# Patient Record
Sex: Female | Born: 1975 | Race: Black or African American | Hispanic: No | Marital: Married | Smoking: Current every day smoker
Health system: Southern US, Community
[De-identification: ages and names within clinical notes are randomized; demographics above are authoritative.]

## PROBLEM LIST (undated history)

## (undated) DIAGNOSIS — F32A Depression, unspecified: Secondary | ICD-10-CM

## (undated) DIAGNOSIS — F329 Major depressive disorder, single episode, unspecified: Secondary | ICD-10-CM

## (undated) DIAGNOSIS — R569 Unspecified convulsions: Secondary | ICD-10-CM

## (undated) DIAGNOSIS — B009 Herpesviral infection, unspecified: Secondary | ICD-10-CM

## (undated) DIAGNOSIS — G4733 Obstructive sleep apnea (adult) (pediatric): Secondary | ICD-10-CM

## (undated) DIAGNOSIS — K5909 Other constipation: Secondary | ICD-10-CM

## (undated) DIAGNOSIS — F419 Anxiety disorder, unspecified: Secondary | ICD-10-CM

## (undated) DIAGNOSIS — J189 Pneumonia, unspecified organism: Secondary | ICD-10-CM

## (undated) DIAGNOSIS — D649 Anemia, unspecified: Secondary | ICD-10-CM

## (undated) DIAGNOSIS — I10 Essential (primary) hypertension: Secondary | ICD-10-CM

## (undated) HISTORY — PX: WRIST FRACTURE SURGERY: SHX121

## (undated) HISTORY — PX: TUBAL LIGATION: SHX77

## (undated) HISTORY — PX: ANKLE SURGERY: SHX546

## (undated) HISTORY — PX: CHOLECYSTECTOMY: SHX55

## (undated) HISTORY — PX: IMPLANTATION VAGAL NERVE STIMULATOR: SUR692

## (undated) HISTORY — PX: HIP SURGERY: SHX245

## (undated) HISTORY — DX: Obstructive sleep apnea (adult) (pediatric): G47.33

---
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
## 1998-01-09 ENCOUNTER — Inpatient Hospital Stay (HOSPITAL_COMMUNITY): Admission: AD | Admit: 1998-01-09 | Discharge: 1998-01-09 | Payer: Self-pay | Admitting: Obstetrics

## 1998-01-18 ENCOUNTER — Inpatient Hospital Stay (HOSPITAL_COMMUNITY): Admission: AD | Admit: 1998-01-18 | Discharge: 1998-01-20 | Payer: Self-pay | Admitting: Obstetrics & Gynecology

## 1998-05-22 ENCOUNTER — Inpatient Hospital Stay (HOSPITAL_COMMUNITY): Admission: EM | Admit: 1998-05-22 | Discharge: 1998-05-23 | Payer: Self-pay | Admitting: Emergency Medicine

## 1998-05-22 ENCOUNTER — Inpatient Hospital Stay (HOSPITAL_COMMUNITY): Admission: AD | Admit: 1998-05-22 | Discharge: 1998-05-22 | Payer: Self-pay | Admitting: *Deleted

## 1999-02-08 ENCOUNTER — Encounter: Payer: Self-pay | Admitting: Emergency Medicine

## 1999-02-08 ENCOUNTER — Emergency Department (HOSPITAL_COMMUNITY): Admission: EM | Admit: 1999-02-08 | Discharge: 1999-02-08 | Payer: Self-pay | Admitting: Emergency Medicine

## 2000-07-14 ENCOUNTER — Emergency Department (HOSPITAL_COMMUNITY): Admission: EM | Admit: 2000-07-14 | Discharge: 2000-07-15 | Payer: Self-pay | Admitting: Emergency Medicine

## 2000-11-08 ENCOUNTER — Emergency Department (HOSPITAL_COMMUNITY): Admission: EM | Admit: 2000-11-08 | Discharge: 2000-11-08 | Payer: Self-pay | Admitting: Emergency Medicine

## 2000-11-08 ENCOUNTER — Encounter: Payer: Self-pay | Admitting: Emergency Medicine

## 2000-12-06 ENCOUNTER — Emergency Department (HOSPITAL_COMMUNITY): Admission: EM | Admit: 2000-12-06 | Discharge: 2000-12-07 | Payer: Self-pay | Admitting: *Deleted

## 2000-12-07 ENCOUNTER — Encounter: Payer: Self-pay | Admitting: Emergency Medicine

## 2001-11-07 ENCOUNTER — Emergency Department (HOSPITAL_COMMUNITY): Admission: EM | Admit: 2001-11-07 | Discharge: 2001-11-07 | Payer: Self-pay | Admitting: Emergency Medicine

## 2002-01-22 ENCOUNTER — Emergency Department (HOSPITAL_COMMUNITY): Admission: EM | Admit: 2002-01-22 | Discharge: 2002-01-23 | Payer: Self-pay | Admitting: Emergency Medicine

## 2002-02-23 ENCOUNTER — Emergency Department (HOSPITAL_COMMUNITY): Admission: EM | Admit: 2002-02-23 | Discharge: 2002-02-23 | Payer: Self-pay | Admitting: Emergency Medicine

## 2002-03-06 ENCOUNTER — Emergency Department (HOSPITAL_COMMUNITY): Admission: EM | Admit: 2002-03-06 | Discharge: 2002-03-06 | Payer: Self-pay | Admitting: Emergency Medicine

## 2002-05-01 ENCOUNTER — Emergency Department (HOSPITAL_COMMUNITY): Admission: EM | Admit: 2002-05-01 | Discharge: 2002-05-01 | Payer: Self-pay | Admitting: Emergency Medicine

## 2002-06-07 ENCOUNTER — Encounter: Payer: Self-pay | Admitting: Emergency Medicine

## 2002-06-07 ENCOUNTER — Emergency Department (HOSPITAL_COMMUNITY): Admission: EM | Admit: 2002-06-07 | Discharge: 2002-06-07 | Payer: Self-pay | Admitting: Emergency Medicine

## 2002-09-08 ENCOUNTER — Encounter: Payer: Self-pay | Admitting: Emergency Medicine

## 2002-09-08 ENCOUNTER — Emergency Department (HOSPITAL_COMMUNITY): Admission: EM | Admit: 2002-09-08 | Discharge: 2002-09-08 | Payer: Self-pay | Admitting: Emergency Medicine

## 2002-11-14 ENCOUNTER — Ambulatory Visit (HOSPITAL_COMMUNITY): Admission: AD | Admit: 2002-11-14 | Discharge: 2002-11-14 | Payer: Self-pay | Admitting: Obstetrics and Gynecology

## 2002-11-14 ENCOUNTER — Encounter: Payer: Self-pay | Admitting: Obstetrics and Gynecology

## 2002-11-14 ENCOUNTER — Encounter (INDEPENDENT_AMBULATORY_CARE_PROVIDER_SITE_OTHER): Payer: Self-pay

## 2002-11-14 ENCOUNTER — Encounter: Payer: Self-pay | Admitting: Emergency Medicine

## 2002-11-17 ENCOUNTER — Inpatient Hospital Stay (HOSPITAL_COMMUNITY): Admission: AD | Admit: 2002-11-17 | Discharge: 2002-11-17 | Payer: Self-pay | Admitting: *Deleted

## 2003-03-18 ENCOUNTER — Inpatient Hospital Stay (HOSPITAL_COMMUNITY): Admission: AD | Admit: 2003-03-18 | Discharge: 2003-03-18 | Payer: Self-pay | Admitting: Obstetrics and Gynecology

## 2003-04-30 ENCOUNTER — Emergency Department (HOSPITAL_COMMUNITY): Admission: EM | Admit: 2003-04-30 | Discharge: 2003-04-30 | Payer: Self-pay | Admitting: Emergency Medicine

## 2003-04-30 ENCOUNTER — Encounter: Payer: Self-pay | Admitting: Emergency Medicine

## 2003-05-07 ENCOUNTER — Emergency Department (HOSPITAL_COMMUNITY): Admission: EM | Admit: 2003-05-07 | Discharge: 2003-05-07 | Payer: Self-pay | Admitting: *Deleted

## 2003-10-08 ENCOUNTER — Emergency Department (HOSPITAL_COMMUNITY): Admission: EM | Admit: 2003-10-08 | Discharge: 2003-10-08 | Payer: Self-pay | Admitting: Emergency Medicine

## 2003-10-16 ENCOUNTER — Other Ambulatory Visit: Admission: RE | Admit: 2003-10-16 | Discharge: 2003-10-16 | Payer: Self-pay | Admitting: Obstetrics and Gynecology

## 2003-11-04 HISTORY — PX: ECTOPIC PREGNANCY SURGERY: SHX613

## 2004-08-22 ENCOUNTER — Emergency Department (HOSPITAL_COMMUNITY): Admission: EM | Admit: 2004-08-22 | Discharge: 2004-08-22 | Payer: Self-pay | Admitting: Emergency Medicine

## 2004-09-24 ENCOUNTER — Inpatient Hospital Stay (HOSPITAL_COMMUNITY): Admission: AD | Admit: 2004-09-24 | Discharge: 2004-09-24 | Payer: Self-pay | Admitting: Obstetrics and Gynecology

## 2004-12-04 ENCOUNTER — Emergency Department (HOSPITAL_COMMUNITY): Admission: AD | Admit: 2004-12-04 | Discharge: 2004-12-04 | Payer: Self-pay | Admitting: Family Medicine

## 2005-03-15 ENCOUNTER — Emergency Department (HOSPITAL_COMMUNITY): Admission: EM | Admit: 2005-03-15 | Discharge: 2005-03-15 | Payer: Self-pay | Admitting: Family Medicine

## 2005-05-06 ENCOUNTER — Emergency Department (HOSPITAL_COMMUNITY): Admission: EM | Admit: 2005-05-06 | Discharge: 2005-05-06 | Payer: Self-pay | Admitting: Emergency Medicine

## 2005-08-09 ENCOUNTER — Emergency Department (HOSPITAL_COMMUNITY): Admission: EM | Admit: 2005-08-09 | Discharge: 2005-08-09 | Payer: Self-pay | Admitting: Emergency Medicine

## 2006-06-11 ENCOUNTER — Emergency Department (HOSPITAL_COMMUNITY): Admission: EM | Admit: 2006-06-11 | Discharge: 2006-06-12 | Payer: Self-pay | Admitting: Emergency Medicine

## 2008-10-27 ENCOUNTER — Emergency Department (HOSPITAL_COMMUNITY): Admission: EM | Admit: 2008-10-27 | Discharge: 2008-10-27 | Payer: Self-pay | Admitting: Family Medicine

## 2008-10-28 ENCOUNTER — Emergency Department (HOSPITAL_COMMUNITY): Admission: EM | Admit: 2008-10-28 | Discharge: 2008-10-28 | Payer: Self-pay | Admitting: Emergency Medicine

## 2008-11-12 ENCOUNTER — Emergency Department (HOSPITAL_COMMUNITY): Admission: EM | Admit: 2008-11-12 | Discharge: 2008-11-12 | Payer: Self-pay | Admitting: Family Medicine

## 2009-07-18 ENCOUNTER — Inpatient Hospital Stay (HOSPITAL_COMMUNITY): Admission: EM | Admit: 2009-07-18 | Discharge: 2009-07-22 | Payer: Self-pay | Admitting: Emergency Medicine

## 2009-07-18 ENCOUNTER — Ambulatory Visit: Payer: Self-pay | Admitting: Pulmonary Disease

## 2009-09-09 ENCOUNTER — Emergency Department (HOSPITAL_COMMUNITY): Admission: EM | Admit: 2009-09-09 | Discharge: 2009-09-09 | Payer: Self-pay | Admitting: Emergency Medicine

## 2010-02-16 ENCOUNTER — Inpatient Hospital Stay (HOSPITAL_COMMUNITY): Admission: AD | Admit: 2010-02-16 | Discharge: 2010-02-17 | Payer: Self-pay | Admitting: Obstetrics and Gynecology

## 2010-06-28 ENCOUNTER — Emergency Department (HOSPITAL_COMMUNITY): Admission: EM | Admit: 2010-06-28 | Discharge: 2010-06-28 | Payer: Self-pay | Admitting: Emergency Medicine

## 2010-08-30 ENCOUNTER — Emergency Department (HOSPITAL_COMMUNITY): Admission: EM | Admit: 2010-08-30 | Discharge: 2010-08-30 | Payer: Self-pay | Admitting: Emergency Medicine

## 2010-10-24 ENCOUNTER — Emergency Department (HOSPITAL_COMMUNITY): Admission: EM | Admit: 2010-10-24 | Discharge: 2010-10-24 | Payer: Self-pay | Admitting: Emergency Medicine

## 2011-02-06 ENCOUNTER — Emergency Department (HOSPITAL_COMMUNITY)
Admission: EM | Admit: 2011-02-06 | Discharge: 2011-02-06 | Disposition: A | Discharge status: Home / Self Care | Payer: Managed Care, Other (non HMO) | Attending: Emergency Medicine | Admitting: Emergency Medicine

## 2011-02-06 DIAGNOSIS — R51 Headache: Secondary | ICD-10-CM | POA: Insufficient documentation

## 2011-02-06 DIAGNOSIS — G40909 Epilepsy, unspecified, not intractable, without status epilepticus: Secondary | ICD-10-CM | POA: Insufficient documentation

## 2011-02-06 DIAGNOSIS — F29 Unspecified psychosis not due to a substance or known physiological condition: Secondary | ICD-10-CM | POA: Insufficient documentation

## 2011-02-06 DIAGNOSIS — D649 Anemia, unspecified: Secondary | ICD-10-CM | POA: Insufficient documentation

## 2011-02-06 LAB — POCT I-STAT, CHEM 8
Calcium, Ion: 1.13 mmol/L (ref 1.12–1.32)
Creatinine, Ser: 0.9 mg/dL (ref 0.4–1.2)
Glucose, Bld: 89 mg/dL (ref 70–99)
HCT: 34 % — ABNORMAL LOW (ref 36.0–46.0)

## 2011-02-06 LAB — PHENYTOIN LEVEL, TOTAL: Phenytoin Lvl: <2.5 ug/mL — ABNORMAL LOW (ref 10.0–20.0)

## 2011-02-14 LAB — DIFFERENTIAL
Basophils Relative: 0 % (ref 0–1)
Eosinophils Absolute: 0.1 10*3/uL (ref 0.0–0.7)
Lymphocytes Relative: 11 % — ABNORMAL LOW (ref 12–46)
Monocytes Absolute: 0.3 10*3/uL (ref 0.1–1.0)
Neutrophils Relative %: 85 % — ABNORMAL HIGH (ref 43–77)

## 2011-02-14 LAB — POCT PREGNANCY, URINE: Preg Test, Ur: NEGATIVE

## 2011-02-14 LAB — RAPID URINE DRUG SCREEN, HOSP PERFORMED
Amphetamines: NOT DETECTED
Barbiturates: NOT DETECTED
Opiates: NOT DETECTED

## 2011-02-14 LAB — URINE MICROSCOPIC-ADD ON

## 2011-02-14 LAB — CBC
HCT: 37.3 % (ref 36.0–46.0)
Hemoglobin: 11.9 g/dL — ABNORMAL LOW (ref 12.0–15.0)
MCHC: 31.9 g/dL (ref 30.0–36.0)
RDW: 15.3 % (ref 11.5–15.5)
WBC: 8.7 10*3/uL (ref 4.0–10.5)

## 2011-02-14 LAB — POCT I-STAT, CHEM 8
Chloride: 111 mEq/L (ref 96–112)
HCT: 38 % (ref 36.0–46.0)
Hemoglobin: 12.9 g/dL (ref 12.0–15.0)
Potassium: 5.3 mEq/L — ABNORMAL HIGH (ref 3.5–5.1)

## 2011-02-14 LAB — BASIC METABOLIC PANEL
Calcium: 9.2 mg/dL (ref 8.4–10.5)
Creatinine, Ser: 0.68 mg/dL (ref 0.4–1.2)
GFR calc Af Amer: >60 mL/min (ref >60)
GFR calc non Af Amer: >60 mL/min (ref >60)
Sodium: 139 mEq/L (ref 135–145)

## 2011-02-14 LAB — GLUCOSE, CAPILLARY: Glucose-Capillary: 125 mg/dL — ABNORMAL HIGH (ref 70–99)

## 2011-02-14 LAB — URINALYSIS, ROUTINE W REFLEX MICROSCOPIC
Bilirubin Urine: NEGATIVE
Glucose, UA: NEGATIVE mg/dL
Specific Gravity, Urine: 1.026 (ref 1.005–1.030)
pH: 5.5 (ref 5.0–8.0)

## 2011-02-14 LAB — PHENYTOIN LEVEL, TOTAL: Phenytoin Lvl: <2.5 ug/mL — ABNORMAL LOW (ref 10.0–20.0)

## 2011-02-18 LAB — DIFFERENTIAL
Basophils Relative: 0 % (ref 0–1)
Eosinophils Relative: 1 % (ref 0–5)
Lymphs Abs: 2 10*3/uL (ref 0.7–4.0)
Monocytes Absolute: 0.3 10*3/uL (ref 0.1–1.0)
Monocytes Relative: 4 % (ref 3–12)
Neutro Abs: 4.6 10*3/uL (ref 1.7–7.7)

## 2011-02-18 LAB — COMPREHENSIVE METABOLIC PANEL
ALT: 13 U/L (ref 0–35)
BUN: 7 mg/dL (ref 6–23)
CO2: 26 mEq/L (ref 19–32)
Calcium: 9.2 mg/dL (ref 8.4–10.5)
Creatinine, Ser: 0.61 mg/dL (ref 0.4–1.2)
GFR calc non Af Amer: >60 mL/min (ref >60)
Glucose, Bld: 87 mg/dL (ref 70–99)
Sodium: 141 mEq/L (ref 135–145)
Total Protein: 6.7 g/dL (ref 6.0–8.3)

## 2011-02-18 LAB — CBC
HCT: 35.2 % — ABNORMAL LOW (ref 36.0–46.0)
MCHC: 34 g/dL (ref 30.0–36.0)
Platelets: ADEQUATE 10*3/uL (ref 150–400)
RDW: 15.6 % — ABNORMAL HIGH (ref 11.5–15.5)

## 2011-02-18 LAB — TYPE AND SCREEN
ABO/RH(D): O POS
Antibody Screen: NEGATIVE

## 2011-02-18 LAB — ABO/RH: ABO/RH(D): O POS

## 2011-02-26 LAB — GC/CHLAMYDIA PROBE AMP, GENITAL
Chlamydia, DNA Probe: NEGATIVE
GC Probe Amp, Genital: NEGATIVE

## 2011-02-26 LAB — URINALYSIS, ROUTINE W REFLEX MICROSCOPIC
Bilirubin Urine: NEGATIVE
Glucose, UA: NEGATIVE mg/dL
Ketones, ur: NEGATIVE mg/dL
Specific Gravity, Urine: 1.01 (ref 1.005–1.030)
pH: 6 (ref 5.0–8.0)

## 2011-02-26 LAB — CBC
Hemoglobin: 9.3 g/dL — ABNORMAL LOW (ref 12.0–15.0)
MCHC: 31.8 g/dL (ref 30.0–36.0)
RBC: 3.51 MIL/uL — ABNORMAL LOW (ref 3.87–5.11)
RDW: 19.1 % — ABNORMAL HIGH (ref 11.5–15.5)

## 2011-02-26 LAB — URINE MICROSCOPIC-ADD ON

## 2011-02-26 LAB — WET PREP, GENITAL: Yeast Wet Prep HPF POC: NONE SEEN

## 2011-02-26 LAB — POCT PREGNANCY, URINE: Preg Test, Ur: NEGATIVE

## 2011-03-09 LAB — CBC
HCT: 31 % — ABNORMAL LOW (ref 36.0–46.0)
MCV: 85.8 fL (ref 78.0–100.0)
Platelets: 253 10*3/uL (ref 150–400)
RDW: 17.8 % — ABNORMAL HIGH (ref 11.5–15.5)
WBC: 5.4 10*3/uL (ref 4.0–10.5)

## 2011-03-09 LAB — URINALYSIS, ROUTINE W REFLEX MICROSCOPIC
Ketones, ur: NEGATIVE mg/dL
Leukocytes, UA: NEGATIVE
Nitrite: POSITIVE — AB
Protein, ur: NEGATIVE mg/dL
Urobilinogen, UA: 0.2 mg/dL (ref 0.0–1.0)

## 2011-03-09 LAB — DIFFERENTIAL
Basophils Absolute: 0.1 10*3/uL (ref 0.0–0.1)
Basophils Relative: 1 % (ref 0–1)
Eosinophils Absolute: 0 10*3/uL (ref 0.0–0.7)
Eosinophils Relative: 0 % (ref 0–5)
Lymphs Abs: 1 10*3/uL (ref 0.7–4.0)
Neutrophils Relative %: 76 % (ref 43–77)

## 2011-03-09 LAB — BASIC METABOLIC PANEL
BUN: 11 mg/dL (ref 6–23)
Chloride: 107 mEq/L (ref 96–112)
Creatinine, Ser: 0.67 mg/dL (ref 0.4–1.2)
Glucose, Bld: 119 mg/dL — ABNORMAL HIGH (ref 70–99)
Potassium: 3.8 mEq/L (ref 3.5–5.1)

## 2011-03-09 LAB — URINE MICROSCOPIC-ADD ON

## 2011-03-09 LAB — POCT PREGNANCY, URINE: Preg Test, Ur: NEGATIVE

## 2011-03-11 LAB — GLUCOSE, CAPILLARY
Glucose-Capillary: 100 mg/dL — ABNORMAL HIGH (ref 70–99)
Glucose-Capillary: 113 mg/dL — ABNORMAL HIGH (ref 70–99)
Glucose-Capillary: 121 mg/dL — ABNORMAL HIGH (ref 70–99)
Glucose-Capillary: 150 mg/dL — ABNORMAL HIGH (ref 70–99)
Glucose-Capillary: 170 mg/dL — ABNORMAL HIGH (ref 70–99)
Glucose-Capillary: 187 mg/dL — ABNORMAL HIGH (ref 70–99)
Glucose-Capillary: 57 mg/dL — ABNORMAL LOW (ref 70–99)
Glucose-Capillary: 63 mg/dL — ABNORMAL LOW (ref 70–99)
Glucose-Capillary: 72 mg/dL (ref 70–99)
Glucose-Capillary: 73 mg/dL (ref 70–99)
Glucose-Capillary: 77 mg/dL (ref 70–99)
Glucose-Capillary: 83 mg/dL (ref 70–99)

## 2011-03-11 LAB — PHENYTOIN LEVEL, TOTAL
Phenytoin Lvl: 8.1 ug/mL — ABNORMAL LOW (ref 10.0–20.0)
Phenytoin Lvl: 12.2 ug/mL (ref 10.0–20.0)

## 2011-03-11 LAB — DIFFERENTIAL
Eosinophils Relative: 1 % (ref 0–5)
Lymphocytes Relative: 20 % (ref 12–46)
Lymphs Abs: 1.3 10*3/uL (ref 0.7–4.0)
Monocytes Absolute: 0.3 10*3/uL (ref 0.1–1.0)

## 2011-03-11 LAB — PROTIME-INR: INR: 1.1 (ref 0.00–1.49)

## 2011-03-11 LAB — CBC
HCT: 26.7 % — ABNORMAL LOW (ref 36.0–46.0)
Hemoglobin: 8.8 g/dL — ABNORMAL LOW (ref 12.0–15.0)
Hemoglobin: 8.9 g/dL — ABNORMAL LOW (ref 12.0–15.0)
MCHC: 32.8 g/dL (ref 30.0–36.0)
MCHC: 32.9 g/dL (ref 30.0–36.0)
MCV: 88.4 fL (ref 78.0–100.0)
Platelets: 168 10*3/uL (ref 150–400)
Platelets: 249 10*3/uL (ref 150–400)
RBC: 2.96 MIL/uL — ABNORMAL LOW (ref 3.87–5.11)
RBC: 3.05 MIL/uL — ABNORMAL LOW (ref 3.87–5.11)
RBC: 3.09 MIL/uL — ABNORMAL LOW (ref 3.87–5.11)
WBC: 4.7 10*3/uL (ref 4.0–10.5)
WBC: 6.8 10*3/uL (ref 4.0–10.5)
WBC: 6.6 10*3/uL (ref 4.0–10.5)

## 2011-03-11 LAB — BASIC METABOLIC PANEL
CO2: 24 mEq/L (ref 19–32)
CO2: 27 mEq/L (ref 19–32)
Calcium: 8.1 mg/dL — ABNORMAL LOW (ref 8.4–10.5)
Calcium: 8.3 mg/dL — ABNORMAL LOW (ref 8.4–10.5)
Calcium: 8.8 mg/dL (ref 8.4–10.5)
Creatinine, Ser: 0.58 mg/dL (ref 0.4–1.2)
Creatinine, Ser: 0.58 mg/dL (ref 0.4–1.2)
GFR calc Af Amer: >60 mL/min (ref >60)
GFR calc Af Amer: >60 mL/min (ref >60)
GFR calc Af Amer: >60 mL/min (ref >60)
GFR calc non Af Amer: >60 mL/min (ref >60)
GFR calc non Af Amer: >60 mL/min (ref >60)
Glucose, Bld: 97 mg/dL (ref 70–99)
Potassium: 2.9 mEq/L — ABNORMAL LOW (ref 3.5–5.1)
Potassium: 3 mEq/L — ABNORMAL LOW (ref 3.5–5.1)
Sodium: 136 mEq/L (ref 135–145)
Sodium: 141 mEq/L (ref 135–145)
Sodium: 137 mEq/L (ref 135–145)

## 2011-03-11 LAB — LEGIONELLA ANTIGEN, URINE: Legionella Antigen, Urine: NEGATIVE

## 2011-03-11 LAB — POCT I-STAT 3, VENOUS BLOOD GAS (G3P V)
Bicarbonate: 22.2 mEq/L (ref 20.0–24.0)
TCO2: 24 mmol/L (ref 0–100)
pCO2, Ven: 48.5 mmHg (ref 45.0–50.0)
pH, Ven: 7.267 (ref 7.250–7.300)

## 2011-03-11 LAB — CULTURE, BLOOD (ROUTINE X 2)
Culture: NO GROWTH
Culture: NO GROWTH

## 2011-03-11 LAB — COMPREHENSIVE METABOLIC PANEL
AST: 30 U/L (ref 0–37)
Albumin: 3.8 g/dL (ref 3.5–5.2)
Chloride: 109 mEq/L (ref 96–112)
Creatinine, Ser: 0.75 mg/dL (ref 0.4–1.2)
GFR calc Af Amer: >60 mL/min (ref >60)
Sodium: 140 mEq/L (ref 135–145)
Total Bilirubin: 0.7 mg/dL (ref 0.3–1.2)

## 2011-03-11 LAB — URINE CULTURE: Colony Count: >=100000

## 2011-03-11 LAB — RAPID URINE DRUG SCREEN, HOSP PERFORMED
Amphetamines: NOT DETECTED
Opiates: NOT DETECTED
Tetrahydrocannabinol: POSITIVE — AB

## 2011-03-11 LAB — POCT I-STAT 3, ART BLOOD GAS (G3+)
Acid-Base Excess: 3 mmol/L — ABNORMAL HIGH (ref 0.0–2.0)
Acid-base deficit: 2 mmol/L (ref 0.0–2.0)
Bicarbonate: 22.8 mEq/L (ref 20.0–24.0)
O2 Saturation: 98 %
O2 Saturation: 100 %
TCO2: 25 mmol/L (ref 0–100)
pCO2 arterial: 40.4 mmHg (ref 35.0–45.0)
pCO2 arterial: 23.8 mmHg — ABNORMAL LOW (ref 35.0–45.0)
pCO2 arterial: 29.1 mmHg — ABNORMAL LOW (ref 35.0–45.0)
pCO2 arterial: 30.6 mmHg — ABNORMAL LOW (ref 35.0–45.0)
pH, Arterial: 7.501 — ABNORMAL HIGH (ref 7.350–7.400)
pO2, Arterial: 115 mmHg — ABNORMAL HIGH (ref 80.0–100.0)
pO2, Arterial: 138 mmHg — ABNORMAL HIGH (ref 80.0–100.0)
pO2, Arterial: 212 mmHg — ABNORMAL HIGH (ref 80.0–100.0)

## 2011-03-11 LAB — URINALYSIS, ROUTINE W REFLEX MICROSCOPIC
Nitrite: POSITIVE — AB
Specific Gravity, Urine: 1.02 (ref 1.005–1.030)
Urobilinogen, UA: 0.2 mg/dL (ref 0.0–1.0)

## 2011-03-11 LAB — POCT CARDIAC MARKERS
CKMB, poc: <1 ng/mL — ABNORMAL LOW (ref 1.0–8.0)
Troponin i, poc: <0.05 ng/mL (ref 0.00–0.09)

## 2011-03-11 LAB — MISCELLANEOUS TEST

## 2011-03-11 LAB — CULTURE, RESPIRATORY W GRAM STAIN: Culture: NORMAL

## 2011-03-11 LAB — POCT PREGNANCY, URINE: Preg Test, Ur: NEGATIVE

## 2011-03-11 LAB — MRSA CULTURE

## 2011-03-11 LAB — URINE MICROSCOPIC-ADD ON

## 2011-03-11 LAB — CARDIAC PANEL(CRET KIN+CKTOT+MB+TROPI)
CK, MB: 1.1 ng/mL (ref 0.3–4.0)
CK, MB: 1.5 ng/mL (ref 0.3–4.0)
Total CK: 232 U/L — ABNORMAL HIGH (ref 7–177)
Troponin I: 0.02 ng/mL (ref 0.00–0.06)

## 2011-03-11 LAB — CK TOTAL AND CKMB (NOT AT ARMC)
CK, MB: 1.7 ng/mL (ref 0.3–4.0)
Relative Index: 0.8 (ref 0.0–2.5)

## 2011-03-11 LAB — MAGNESIUM
Magnesium: 1.9 mg/dL (ref 1.5–2.5)
Magnesium: 1.9 mg/dL (ref 1.5–2.5)

## 2011-03-11 LAB — ETHANOL: Alcohol, Ethyl (B): <5 mg/dL (ref 0–10)

## 2011-03-11 LAB — PHOSPHORUS: Phosphorus: 3 mg/dL (ref 2.3–4.6)

## 2011-03-22 ENCOUNTER — Emergency Department (HOSPITAL_COMMUNITY)
Admission: EM | Admit: 2011-03-22 | Discharge: 2011-03-22 | Disposition: A | Discharge status: Home / Self Care | Payer: Managed Care, Other (non HMO) | Attending: Emergency Medicine | Admitting: Emergency Medicine

## 2011-03-22 DIAGNOSIS — Z9119 Patient's noncompliance with other medical treatment and regimen: Secondary | ICD-10-CM | POA: Insufficient documentation

## 2011-03-22 DIAGNOSIS — Z79899 Other long term (current) drug therapy: Secondary | ICD-10-CM | POA: Insufficient documentation

## 2011-03-22 DIAGNOSIS — G40909 Epilepsy, unspecified, not intractable, without status epilepticus: Secondary | ICD-10-CM | POA: Insufficient documentation

## 2011-03-22 DIAGNOSIS — Z91199 Patient's noncompliance with other medical treatment and regimen due to unspecified reason: Secondary | ICD-10-CM | POA: Insufficient documentation

## 2011-03-22 LAB — POCT I-STAT, CHEM 8
Calcium, Ion: 1.26 mmol/L (ref 1.12–1.32)
Chloride: 107 mEq/L (ref 96–112)
Creatinine, Ser: 0.9 mg/dL (ref 0.4–1.2)
Glucose, Bld: 79 mg/dL (ref 70–99)
HCT: 35 % — ABNORMAL LOW (ref 36.0–46.0)
Potassium: 4.2 mEq/L (ref 3.5–5.1)
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
## 2011-04-18 NOTE — Consult Note (Signed)
NAMEABIGAEL, MOGLE NO.:  [id number]   MEDICAL RECORD NO.:  [id number]          PATIENT TYPE:  INP   LOCATION:  2111                         FACILITY:  [facility]   PHYSICIAN:  Noel Christmas, MD    DATE OF BIRTH:  09/16/1976   DATE OF CONSULTATION:  07/18/2009  DATE OF DISCHARGE:                                 CONSULTATION   REFERRING SERVICE:  PCCM, MD   REASON FOR CONSULTATION:  Recurrent generalized seizures.   HISTORY OF PRESENT ILLNESS:  This is a 35 year old African American lady  who presented with history of three witnessed generalized seizures.  The  first occurred around 6 a.m. today and following two occurred after  arriving in the emergency room.  The patient was initially given Ativan.  Because of recurrent seizures, she was subsequently given Versed and  succinylcholine and intubated and placed on mechanical ventilation.  She  has required fentanyl for sedation.  She has had no recurrent seizures  since intubation.  Dilantin load 1000 mg was ordered and is being  started at this point.  The patient has no history of seizure activity.  She is being treated for urinary tract infection and was started on two  antibiotics within the past few days one of which is Bactrim.  The other  one is unclear but the family will bring in her medications for  verification.  CT scan of her head was unremarkable with no signs of  acute intracranial hemorrhage.  Urine drug screen was positive for THC  and benzodiazepines.   PAST MEDICAL HISTORY:  Remarkable for herpes simplex (unclear type from  history available so far).   CURRENT MEDICATIONS:  None other than Bactrim.   FAMILY HISTORY:  Noncontributory.   EXAMINATION:  Appearance was that of an obese young lady who was  intubated and on mechanical ventilation.  The patient had spontaneous  respirations as well.  She was responsive slightly to noxious stimuli.  Pupils were equal and reactive normally to light.   Extraocular movements  were intact to oculocephalic maneuvers.  There was no facial asymmetry.  Muscle tone was flaccid throughout.  She had no abnormal posturing and  no spontaneous movements.  Deep tendon reflexes were 2+ and symmetrical  throughout.  Plantar responses were flexor.  Carotid auscultation was  normal.   CLINICAL IMPRESSION:  1. New-onset generalized seizures of unclear etiology.  Possible      reaction to antibiotic regimen for urinary tract infection cannot      be ruled out.  2. History of herpes simplex of unclear significance with respect to      the patient's presentation, if any.  It is doubtful if the patient      has herpes encephalitis, particularly if she has genital herpes by      history.  3. No signs of an acute stroke nor significant intracranial pathology      otherwise.   RECOMMENDATIONS:  1. MRI of the brain without and with contrast.  2. EEG in the a.m.  3. Continue Dilantin at 100 mg q.8 hours.  4. Dilantin level this  evening as well as in the a.m.   Thank you for asking me to evaluate [name].      Noel Christmas, MD  Electronically Signed     CS/MEDQ  D:  07/18/2009  T:  07/18/2009  Job:  [phone]

## 2011-04-18 NOTE — Discharge Summary (Signed)
Veronica Francis, Veronica Francis            ACCOUNT NO.:  [id number]   MEDICAL RECORD NO.:  [id number]          PATIENT TYPE:  INP   LOCATION:  5526                         FACILITY:  [facility]   PHYSICIAN:  Beckey Rutter, MD  DATE OF BIRTH:  12/24/1975   DATE OF ADMISSION:  07/18/2009  DATE OF DISCHARGE:  07/22/2009                               DISCHARGE SUMMARY   PRIMARY CARE PHYSICIAN:  Unassigned.   HISTORY OF PRESENT ILLNESS:  This is a 35 year old African American  female with past medical history of herpes simplex who presented with  seizure and admitted to the ICU secondary to his respiratory failure  secondary to seizures.  The patient weaned off the ventilator as  tolerated.   DISCHARGE DIAGNOSES:  1. New onset seizure.  Still questionable if the patient has true      seizures.  Nevertheless, the patient will be discharged with      Dilantin.  2. Ventilator-dependent respiratory failure secondary to seizures.  3. Urinary tract infection treated with intravenous antibiotic.  4. History of herpes simplex.   DISCHARGE MEDICATIONS:  1. Ciprofloxacin 500 mg p.o. twice a day for two more days.  2. Celexa 10 mg p.o. daily.  3. Zyprexa 10 mg p.o. q. 12.  4. Protonix 40 mg IV.  5. Dilantin 100 mg p.o. every 8 hours.  6. Potassium chloride 20 mEq p.o. daily for three more days.   HOSPITAL CONSULTATION:  1. Critical Care Team.  The patient transferred to Medical Service as      of yesterday.  2. Neurology, Dr. Roseanne Reno.   HOSPITAL PROCEDURES:  1. EEG read today July 22, 2009 by Dr. Vickey Huger impression is no      evidence of seizure activity with a caveat of motion degraded      studies.  Chest x-ray on July 18, 2009 impression is endotracheal      tube in a good position.  2. CT head without contrast on July 18, 2009 the patient had no      acute intracranial pathology.  3. Chest x-ray on July 18, 2009 impression is right upper lobe      atelectasis.  4. MRI brain  without and with contrast on July 18, 2009 impression      is no acute intracranial normality.  Acute on chronic sinusitis.   DISCHARGE/PLAN:  I discussed the discharge plan at length with the  patient and the patient's husband for more than 20 minutes.  The patient  will be discharged on Dilantin.  She should follow up with neurologist  within a week.  The number for [facility] in [place] was  provided on the discharge instruction.  The patient was  advised to refrain from driving or to operate machinery before seeing  her neurologist.  She is aware and agreeable to discharge and followup  plan.   Time spent more than 30 minutes.      Beckey Rutter, MD  Electronically Signed     EME/MEDQ  D:  07/22/2009  T:  07/23/2009  Job:  [id number]

## 2011-04-21 NOTE — Op Note (Signed)
NAME:  Veronica Francis, Veronica Francis NO.:  [id number]   MEDICAL RECORD NO.:  [id number]                   PATIENT TYPE:  MAT   LOCATION:  MATC                                 FACILITY:  [facility]   PHYSICIAN:  Cynthia P. Romine, M.D.             DATE OF BIRTH:  May 11, 1976   DATE OF PROCEDURE:  11/14/2002  DATE OF DISCHARGE:                                 OPERATIVE REPORT   PREOPERATIVE DIAGNOSIS:  Ruptured right tubal ectopic pregnancy.   POSTOPERATIVE DIAGNOSIS:  Ruptured right tubal ectopic pregnancy, pathology  pending.   PROCEDURE:  Laparoscopic right salpingectomy.   SURGEON:  Cynthia P. Romine, M.D.   ANESTHESIA:  General endotracheal.   ESTIMATED BLOOD LOSS:  300 cc.   COMPLICATIONS:  None.   FINDINGS:  Upon entry of the laparoscope into the peritoneal cavity there  was noted to be a large amount of clotted blood in the abdomen and pelvis  approximately 300 cc.  The right tube was distended in its mid isthmic  portion with the bleeding area consistent with an ectopic pregnancy.  The  left tube and ovary appeared normal.   PROCEDURE:  The patient was taken to the operating room and after the  induction of adequate general endotracheal anesthesia was placed in the  dorsal lithotomy position and prepped and draped in the usual position.  Foley catheter had been previously inserted.  A Hulka uterine manipulator  was placed.  A subumbilical incision was made at the site of the previous  incision and a Veress needle was inserted into the peritoneal space.  Proper  placement was tested by noting free flow of saline through the Veress needle  with a negative aspirate and then by noting the response of a drop of saline  placed at the hub of the Veress needle to negative pressure after the  abdominal wall was elevated.  Pneumoperitoneum was created with 2 L CO2 with  the automatic insufflator.  A disposable 10-11 mm trocar was then inserted  into the peritoneal  space and its proper placement noted with the  laparoscope.  Transillumination was used to insert two 5 mm trocars directly  over the pubis on the right and left under direct visualization.  The uterus  was elevated.  The right tube was traced to its fimbriated end.  The dilated  isthmic area that was bleeding with the ruptured ectopic pregnancy was  identified.  Photographic documentation was taken.  A tripolar cautery unit  was used to cauterize the tube at its junction with the uterus and then  tripolar was used to cauterize and cut along the mesosalpinx to the  fimbriae.  The tube was grasped in its fimbriated end with pickups and  brought out through the left _______ trocar.  The incision line was  irrigated and was free of bleeding.  The right ovary appeared normal,  although it  had some filmy adhesions surrounding it.  On the left side the  tube and ovary also appeared normal.  Photographic documentation was taken.  A significant amount of time was spent clearing the pelvis and abdomen of  clots.  To be able to assure that hemostasis was intact, again, a suture  line was visualized and  noted to be hemostatic.  The instruments removed from the abdomen.  The  pneumoperitoneum was allowed to escape.  Sleeves were removed.  The  incisions were closed subcuticularly with 4-0 Vicryl Rapide and the  procedure was terminated.  The patient went in satisfactory condition to  post anesthesia recovery.                                               Cynthia P. Romine, M.D.    CPR/MEDQ  D:  11/14/2002  T:  11/14/2002  Job:  [id number]

## 2011-05-17 ENCOUNTER — Emergency Department (HOSPITAL_COMMUNITY)
Admission: EM | Admit: 2011-05-17 | Discharge: 2011-05-17 | Disposition: A | Discharge status: Home / Self Care | Payer: Managed Care, Other (non HMO) | Attending: Emergency Medicine | Admitting: Emergency Medicine

## 2011-05-17 DIAGNOSIS — Z79899 Other long term (current) drug therapy: Secondary | ICD-10-CM | POA: Insufficient documentation

## 2011-05-17 DIAGNOSIS — R6889 Other general symptoms and signs: Secondary | ICD-10-CM | POA: Insufficient documentation

## 2011-05-17 DIAGNOSIS — F29 Unspecified psychosis not due to a substance or known physiological condition: Secondary | ICD-10-CM | POA: Insufficient documentation

## 2011-05-17 DIAGNOSIS — G40909 Epilepsy, unspecified, not intractable, without status epilepticus: Secondary | ICD-10-CM | POA: Insufficient documentation

## 2011-05-17 LAB — POCT I-STAT, CHEM 8
BUN: 18 mg/dL (ref 6–23)
Calcium, Ion: 1.17 mmol/L (ref 1.12–1.32)
Chloride: 109 mEq/L (ref 96–112)
Creatinine, Ser: 0.6 mg/dL (ref 0.4–1.2)
Glucose, Bld: 108 mg/dL — ABNORMAL HIGH (ref 70–99)
HCT: 34 % — ABNORMAL LOW (ref 36.0–46.0)
Hemoglobin: 11.6 g/dL — ABNORMAL LOW (ref 12.0–15.0)
Potassium: 4 mEq/L (ref 3.5–5.1)
Sodium: 138 meq/L (ref 135–145)
TCO2: 20 mmol/L (ref 0–100)

## 2011-05-17 LAB — URINALYSIS, ROUTINE W REFLEX MICROSCOPIC
Bilirubin Urine: NEGATIVE
Glucose, UA: NEGATIVE mg/dL
Hgb urine dipstick: NEGATIVE
Ketones, ur: NEGATIVE mg/dL
Leukocytes, UA: NEGATIVE
Nitrite: NEGATIVE
Protein, ur: NEGATIVE mg/dL
Specific Gravity, Urine: 1.022 (ref 1.005–1.030)
Urobilinogen, UA: 0.2 mg/dL (ref 0.0–1.0)
pH: 6 (ref 5.0–8.0)

## 2011-05-17 LAB — RAPID URINE DRUG SCREEN, HOSP PERFORMED
Amphetamines: NOT DETECTED
Barbiturates: NOT DETECTED
Benzodiazepines: NOT DETECTED
Cocaine: NOT DETECTED
Opiates: NOT DETECTED
Tetrahydrocannabinol: POSITIVE — AB

## 2011-05-17 LAB — POCT PREGNANCY, URINE: Preg Test, Ur: NEGATIVE

## 2011-05-17 LAB — GLUCOSE, CAPILLARY: Glucose-Capillary: 107 mg/dL — ABNORMAL HIGH (ref 70–99)

## 2011-06-26 ENCOUNTER — Emergency Department (HOSPITAL_COMMUNITY): Payer: Managed Care, Other (non HMO)

## 2011-06-26 ENCOUNTER — Inpatient Hospital Stay (HOSPITAL_COMMUNITY): Payer: Managed Care, Other (non HMO)

## 2011-06-26 ENCOUNTER — Inpatient Hospital Stay (HOSPITAL_COMMUNITY)
Admission: EM | Admit: 2011-06-26 | Discharge: 2011-07-05 | DRG: 956 | Disposition: A | Discharge status: Skilled Nursing Facility | Payer: Managed Care, Other (non HMO) | Attending: General Surgery | Admitting: General Surgery

## 2011-06-26 DIAGNOSIS — S27329A Contusion of lung, unspecified, initial encounter: Secondary | ICD-10-CM | POA: Diagnosis present

## 2011-06-26 DIAGNOSIS — D62 Acute posthemorrhagic anemia: Secondary | ICD-10-CM | POA: Diagnosis not present

## 2011-06-26 DIAGNOSIS — S7290XA Unspecified fracture of unspecified femur, initial encounter for closed fracture: Secondary | ICD-10-CM

## 2011-06-26 DIAGNOSIS — F1210 Cannabis abuse, uncomplicated: Secondary | ICD-10-CM | POA: Diagnosis present

## 2011-06-26 DIAGNOSIS — S72309A Unspecified fracture of shaft of unspecified femur, initial encounter for closed fracture: Secondary | ICD-10-CM | POA: Diagnosis present

## 2011-06-26 DIAGNOSIS — J9600 Acute respiratory failure, unspecified whether with hypoxia or hypercapnia: Secondary | ICD-10-CM | POA: Diagnosis present

## 2011-06-26 DIAGNOSIS — E876 Hypokalemia: Secondary | ICD-10-CM | POA: Diagnosis not present

## 2011-06-26 DIAGNOSIS — G40909 Epilepsy, unspecified, not intractable, without status epilepticus: Secondary | ICD-10-CM | POA: Diagnosis present

## 2011-06-26 DIAGNOSIS — S0180XA Unspecified open wound of other part of head, initial encounter: Secondary | ICD-10-CM | POA: Diagnosis present

## 2011-06-26 DIAGNOSIS — F19921 Other psychoactive substance use, unspecified with intoxication with delirium: Secondary | ICD-10-CM | POA: Diagnosis not present

## 2011-06-26 DIAGNOSIS — F1010 Alcohol abuse, uncomplicated: Secondary | ICD-10-CM | POA: Diagnosis present

## 2011-06-26 DIAGNOSIS — S61209A Unspecified open wound of unspecified finger without damage to nail, initial encounter: Secondary | ICD-10-CM | POA: Diagnosis present

## 2011-06-26 DIAGNOSIS — S2249XA Multiple fractures of ribs, unspecified side, initial encounter for closed fracture: Secondary | ICD-10-CM | POA: Diagnosis present

## 2011-06-26 DIAGNOSIS — S82843B Displaced bimalleolar fracture of unspecified lower leg, initial encounter for open fracture type I or II: Principal | ICD-10-CM | POA: Diagnosis present

## 2011-06-26 DIAGNOSIS — I10 Essential (primary) hypertension: Secondary | ICD-10-CM | POA: Diagnosis not present

## 2011-06-26 DIAGNOSIS — S62123A Displaced fracture of lunate [semilunar], unspecified wrist, initial encounter for closed fracture: Secondary | ICD-10-CM | POA: Diagnosis present

## 2011-06-26 DIAGNOSIS — E669 Obesity, unspecified: Secondary | ICD-10-CM | POA: Diagnosis present

## 2011-06-26 DIAGNOSIS — S8253XA Displaced fracture of medial malleolus of unspecified tibia, initial encounter for closed fracture: Secondary | ICD-10-CM | POA: Diagnosis present

## 2011-06-26 DIAGNOSIS — Y92410 Unspecified street and highway as the place of occurrence of the external cause: Secondary | ICD-10-CM

## 2011-06-26 DIAGNOSIS — S82899A Other fracture of unspecified lower leg, initial encounter for closed fracture: Secondary | ICD-10-CM | POA: Diagnosis present

## 2011-06-26 DIAGNOSIS — S63036A Dislocation of midcarpal joint of unspecified wrist, initial encounter: Secondary | ICD-10-CM | POA: Diagnosis present

## 2011-06-26 DIAGNOSIS — S72009A Fracture of unspecified part of neck of unspecified femur, initial encounter for closed fracture: Secondary | ICD-10-CM | POA: Diagnosis present

## 2011-06-26 DIAGNOSIS — S86909A Unspecified injury of unspecified muscle(s) and tendon(s) at lower leg level, unspecified leg, initial encounter: Secondary | ICD-10-CM | POA: Diagnosis present

## 2011-06-26 DIAGNOSIS — F17200 Nicotine dependence, unspecified, uncomplicated: Secondary | ICD-10-CM | POA: Diagnosis present

## 2011-06-26 HISTORY — DX: Unspecified convulsions: R56.9

## 2011-06-26 LAB — PHENYTOIN LEVEL, TOTAL: Phenytoin Lvl: 3.2 ug/mL — ABNORMAL LOW (ref 10.0–20.0)

## 2011-06-26 LAB — POCT I-STAT, CHEM 8
BUN: 15 mg/dL (ref 6–23)
Calcium, Ion: 1.17 mmol/L (ref 1.12–1.32)
Creatinine, Ser: 0.5 mg/dL (ref 0.50–1.10)
TCO2: 21 mmol/L (ref 0–100)

## 2011-06-26 LAB — COMPREHENSIVE METABOLIC PANEL
ALT: 53 U/L — ABNORMAL HIGH (ref 0–35)
CO2: 21 mEq/L (ref 19–32)
Calcium: 9.1 mg/dL (ref 8.4–10.5)
Chloride: 105 mEq/L (ref 96–112)
GFR calc Af Amer: >60 mL/min (ref >60)
GFR calc non Af Amer: >60 mL/min (ref >60)
Glucose, Bld: 178 mg/dL — ABNORMAL HIGH (ref 70–99)
Sodium: 139 mEq/L (ref 135–145)
Total Bilirubin: 0.1 mg/dL — ABNORMAL LOW (ref 0.3–1.2)

## 2011-06-26 LAB — CBC
HCT: 31.6 % — ABNORMAL LOW (ref 36.0–46.0)
MCH: 27.3 pg (ref 26.0–34.0)
MCV: 86.3 fL (ref 78.0–100.0)
Platelets: 330 10*3/uL (ref 150–400)
RBC: 3.66 MIL/uL — ABNORMAL LOW (ref 3.87–5.11)
RDW: 16.3 % — ABNORMAL HIGH (ref 11.5–15.5)

## 2011-06-26 LAB — GLUCOSE, CAPILLARY: Glucose-Capillary: 120 mg/dL — ABNORMAL HIGH (ref 70–99)

## 2011-06-26 MED ORDER — IOHEXOL 300 MG/ML  SOLN
80.0000 mL | Freq: Once | INTRAMUSCULAR | Status: AC | PRN
  Administered 2011-06-26: 80 mL via INTRAVENOUS

## 2011-06-27 ENCOUNTER — Inpatient Hospital Stay (HOSPITAL_COMMUNITY): Payer: Managed Care, Other (non HMO)

## 2011-06-27 LAB — POCT I-STAT 4, (NA,K, GLUC, HGB,HCT)
Glucose, Bld: 110 mg/dL — ABNORMAL HIGH (ref 70–99)
Glucose, Bld: 147 mg/dL — ABNORMAL HIGH (ref 70–99)
HCT: 25 % — ABNORMAL LOW (ref 36.0–46.0)
HCT: 28 % — ABNORMAL LOW (ref 36.0–46.0)
Hemoglobin: 8.5 g/dL — ABNORMAL LOW (ref 12.0–15.0)
Hemoglobin: 8.5 g/dL — ABNORMAL LOW (ref 12.0–15.0)
Hemoglobin: 9.2 g/dL — ABNORMAL LOW (ref 12.0–15.0)
Hemoglobin: 9.5 g/dL — ABNORMAL LOW (ref 12.0–15.0)
Potassium: 3.6 meq/L (ref 3.5–5.1)
Potassium: 3.2 meq/L — ABNORMAL LOW (ref 3.5–5.1)
Sodium: 141 meq/L (ref 135–145)
Sodium: 141 meq/L (ref 135–145)
Sodium: 140 meq/L (ref 135–145)

## 2011-06-27 LAB — URINALYSIS, ROUTINE W REFLEX MICROSCOPIC
Bilirubin Urine: NEGATIVE
Nitrite: NEGATIVE
Protein, ur: NEGATIVE mg/dL
Specific Gravity, Urine: 1.016 (ref 1.005–1.030)
Urobilinogen, UA: 0.2 mg/dL (ref 0.0–1.0)

## 2011-06-27 LAB — CBC
HCT: 25.3 % — ABNORMAL LOW (ref 36.0–46.0)
Hemoglobin: 9.1 g/dL — ABNORMAL LOW (ref 12.0–15.0)
Hemoglobin: 8.7 g/dL — ABNORMAL LOW (ref 12.0–15.0)
MCV: 83.5 fL (ref 78.0–100.0)
Platelets: 152 10*3/uL (ref 150–400)
Platelets: 170 10*3/uL (ref 150–400)
RBC: 3.22 MIL/uL — ABNORMAL LOW (ref 3.87–5.11)
RBC: 3.03 MIL/uL — ABNORMAL LOW (ref 3.87–5.11)
WBC: 5.5 10*3/uL (ref 4.0–10.5)
WBC: 5.9 10*3/uL (ref 4.0–10.5)

## 2011-06-27 LAB — PREPARE FRESH FROZEN PLASMA
Unit division: 0
Unit division: 0

## 2011-06-27 LAB — BASIC METABOLIC PANEL
BUN: 7 mg/dL (ref 6–23)
CO2: 24 mEq/L (ref 19–32)
CO2: 24 meq/L (ref 19–32)
Chloride: 106 mEq/L (ref 96–112)
Chloride: 107 meq/L (ref 96–112)
Creatinine, Ser: 0.49 mg/dL — ABNORMAL LOW (ref 0.50–1.10)
Glucose, Bld: 137 mg/dL — ABNORMAL HIGH (ref 70–99)
Glucose, Bld: 123 mg/dL — ABNORMAL HIGH (ref 70–99)
Sodium: 137 mEq/L (ref 135–145)

## 2011-06-27 LAB — POCT I-STAT 3, ART BLOOD GAS (G3+)
Bicarbonate: 25.2 meq/L — ABNORMAL HIGH (ref 20.0–24.0)
TCO2: 26 mmol/L (ref 0–100)
pH, Arterial: 7.396 (ref 7.350–7.400)
pO2, Arterial: 105 mmHg — ABNORMAL HIGH (ref 80.0–100.0)

## 2011-06-27 LAB — URINE MICROSCOPIC-ADD ON

## 2011-06-28 ENCOUNTER — Inpatient Hospital Stay (HOSPITAL_COMMUNITY): Payer: Managed Care, Other (non HMO)

## 2011-06-28 ENCOUNTER — Encounter (HOSPITAL_COMMUNITY): Payer: Self-pay

## 2011-06-28 LAB — BASIC METABOLIC PANEL
BUN: 4 mg/dL — ABNORMAL LOW (ref 6–23)
Chloride: 104 mEq/L (ref 96–112)
Creatinine, Ser: 0.5 mg/dL (ref 0.50–1.10)
Glucose, Bld: 131 mg/dL — ABNORMAL HIGH (ref 70–99)
Potassium: 4.3 mEq/L (ref 3.5–5.1)

## 2011-06-28 LAB — CBC
HCT: 23.1 % — ABNORMAL LOW (ref 36.0–46.0)
Hemoglobin: 7.8 g/dL — ABNORMAL LOW (ref 12.0–15.0)
MCH: 28.4 pg (ref 26.0–34.0)
MCHC: 33.8 g/dL (ref 30.0–36.0)
MCV: 84 fL (ref 78.0–100.0)
RBC: 2.75 MIL/uL — ABNORMAL LOW (ref 3.87–5.11)

## 2011-06-29 ENCOUNTER — Inpatient Hospital Stay (HOSPITAL_COMMUNITY): Payer: Managed Care, Other (non HMO)

## 2011-06-29 LAB — COMPREHENSIVE METABOLIC PANEL
ALT: 38 U/L — ABNORMAL HIGH (ref 0–35)
BUN: 6 mg/dL (ref 6–23)
CO2: 25 mEq/L (ref 19–32)
Calcium: 8.9 mg/dL (ref 8.4–10.5)
Creatinine, Ser: 0.5 mg/dL (ref 0.50–1.10)
GFR calc Af Amer: >60 mL/min (ref >60)
GFR calc non Af Amer: >60 mL/min (ref >60)
Glucose, Bld: 127 mg/dL — ABNORMAL HIGH (ref 70–99)

## 2011-06-29 LAB — TYPE AND SCREEN
Antibody Screen: NEGATIVE
Unit division: 0
Unit division: 0
Unit division: 0
Unit division: 0
Unit division: 0

## 2011-06-29 LAB — PROTIME-INR
INR: 1.15 (ref 0.00–1.49)
Prothrombin Time: 14.9 seconds (ref 11.6–15.2)

## 2011-06-29 LAB — CBC
MCH: 28.2 pg (ref 26.0–34.0)
Platelets: 130 10*3/uL — ABNORMAL LOW (ref 150–400)
RBC: 2.55 MIL/uL — ABNORMAL LOW (ref 3.87–5.11)

## 2011-06-29 LAB — PHENYTOIN LEVEL, TOTAL: Phenytoin Lvl: 9.9 ug/mL — ABNORMAL LOW (ref 10.0–20.0)
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
## 2011-06-30 ENCOUNTER — Inpatient Hospital Stay (HOSPITAL_COMMUNITY): Payer: Managed Care, Other (non HMO)

## 2011-06-30 LAB — PREPARE RBC (CROSSMATCH)

## 2011-06-30 LAB — CBC
HCT: 20 % — ABNORMAL LOW (ref 36.0–46.0)
Hemoglobin: 6.8 g/dL — CL (ref 12.0–15.0)
MCH: 28.6 pg (ref 26.0–34.0)
MCHC: 34 g/dL (ref 30.0–36.0)

## 2011-06-30 LAB — BASIC METABOLIC PANEL
BUN: 6 mg/dL (ref 6–23)
Chloride: 104 mEq/L (ref 96–112)
Glucose, Bld: 129 mg/dL — ABNORMAL HIGH (ref 70–99)
Potassium: 3.7 mEq/L (ref 3.5–5.1)
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
## 2011-07-01 LAB — CBC
MCV: 84 fL (ref 78.0–100.0)
Platelets: 135 10*3/uL — ABNORMAL LOW (ref 150–400)
RBC: 3.07 MIL/uL — ABNORMAL LOW (ref 3.87–5.11)
RDW: 15.9 % — ABNORMAL HIGH (ref 11.5–15.5)
WBC: 7.4 10*3/uL (ref 4.0–10.5)

## 2011-07-01 LAB — TYPE AND SCREEN
Antibody Screen: NEGATIVE
Unit division: 0

## 2011-07-02 LAB — BASIC METABOLIC PANEL
BUN: 7 mg/dL (ref 6–23)
CO2: 27 mEq/L (ref 19–32)
Chloride: 103 mEq/L (ref 96–112)
Glucose, Bld: 96 mg/dL (ref 70–99)
Potassium: 2.9 mEq/L — ABNORMAL LOW (ref 3.5–5.1)
Sodium: 139 mEq/L (ref 135–145)

## 2011-07-02 LAB — CBC
HCT: 27.8 % — ABNORMAL LOW (ref 36.0–46.0)
Hemoglobin: 9.4 g/dL — ABNORMAL LOW (ref 12.0–15.0)
MCHC: 33.8 g/dL (ref 30.0–36.0)
RBC: 3.31 MIL/uL — ABNORMAL LOW (ref 3.87–5.11)

## 2011-07-03 DIAGNOSIS — R569 Unspecified convulsions: Secondary | ICD-10-CM

## 2011-07-03 DIAGNOSIS — S72309A Unspecified fracture of shaft of unspecified femur, initial encounter for closed fracture: Secondary | ICD-10-CM

## 2011-07-03 DIAGNOSIS — S82109A Unspecified fracture of upper end of unspecified tibia, initial encounter for closed fracture: Secondary | ICD-10-CM

## 2011-07-03 DIAGNOSIS — S82843A Displaced bimalleolar fracture of unspecified lower leg, initial encounter for closed fracture: Secondary | ICD-10-CM

## 2011-07-03 DIAGNOSIS — S72009A Fracture of unspecified part of neck of unspecified femur, initial encounter for closed fracture: Secondary | ICD-10-CM

## 2011-07-03 LAB — CBC
HCT: 28.7 % — ABNORMAL LOW (ref 36.0–46.0)
Hemoglobin: 9.6 g/dL — ABNORMAL LOW (ref 12.0–15.0)
MCH: 28.6 pg (ref 26.0–34.0)
MCHC: 33.4 g/dL (ref 30.0–36.0)
MCV: 85.4 fL (ref 78.0–100.0)
RBC: 3.36 MIL/uL — ABNORMAL LOW (ref 3.87–5.11)

## 2011-07-03 LAB — COMPREHENSIVE METABOLIC PANEL
ALT: 22 U/L (ref 0–35)
BUN: 6 mg/dL (ref 6–23)
CO2: 25 mEq/L (ref 19–32)
Calcium: 9 mg/dL (ref 8.4–10.5)
Glucose, Bld: 93 mg/dL (ref 70–99)
Sodium: 137 mEq/L (ref 135–145)
Total Protein: 5.7 g/dL — ABNORMAL LOW (ref 6.0–8.3)

## 2011-07-03 LAB — PHENYTOIN LEVEL, TOTAL: Phenytoin Lvl: 7.6 ug/mL — ABNORMAL LOW (ref 10.0–20.0)

## 2011-07-05 LAB — PROTIME-INR: INR: 1.05 (ref 0.00–1.49)
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
## 2011-08-02 ENCOUNTER — Ambulatory Visit (HOSPITAL_COMMUNITY)
Admission: RE | Admit: 2011-08-02 | Discharge: 2011-08-03 | Disposition: A | Discharge status: Skilled Nursing Facility | Payer: Managed Care, Other (non HMO) | Source: Ambulatory Visit | Attending: Orthopedic Surgery | Admitting: Orthopedic Surgery

## 2011-08-02 DIAGNOSIS — Y929 Unspecified place or not applicable: Secondary | ICD-10-CM | POA: Insufficient documentation

## 2011-08-02 DIAGNOSIS — S62109A Fracture of unspecified carpal bone, unspecified wrist, initial encounter for closed fracture: Secondary | ICD-10-CM | POA: Insufficient documentation

## 2011-08-02 DIAGNOSIS — X58XXXA Exposure to other specified factors, initial encounter: Secondary | ICD-10-CM | POA: Insufficient documentation

## 2011-08-02 DIAGNOSIS — F17200 Nicotine dependence, unspecified, uncomplicated: Secondary | ICD-10-CM | POA: Insufficient documentation

## 2011-08-02 LAB — CBC
Hemoglobin: 12.9 g/dL (ref 12.0–15.0)
MCH: 29.9 pg (ref 26.0–34.0)
MCHC: 33.3 g/dL (ref 30.0–36.0)
MCV: 89.8 fL (ref 78.0–100.0)
Platelets: 296 10*3/uL (ref 150–400)
RBC: 4.31 MIL/uL (ref 3.87–5.11)

## 2011-08-02 LAB — DIFFERENTIAL
Eosinophils Absolute: 0.1 10*3/uL (ref 0.0–0.7)
Lymphs Abs: 1.4 10*3/uL (ref 0.7–4.0)
Monocytes Absolute: 0.4 10*3/uL (ref 0.1–1.0)
Monocytes Relative: 6 % (ref 3–12)
Neutrophils Relative %: 71 % (ref 43–77)

## 2011-08-02 LAB — SURGICAL PCR SCREEN: MRSA, PCR: NEGATIVE

## 2011-08-03 LAB — PROTIME-INR: INR: 2.12 — ABNORMAL HIGH (ref 0.00–1.49)
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
## 2011-08-06 NOTE — Op Note (Signed)
  NAMEKRIYA, WESTRA NO.:  [id number]  MEDICAL RECORD NO.:  [id number]  LOCATION:  5023                         FACILITY:  [facility]  PHYSICIAN:  Madelynn Done, MD  DATE OF BIRTH:  1976-01-04  DATE OF PROCEDURE:  08/02/2011 DATE OF DISCHARGE:                              OPERATIVE REPORT   PREOPERATIVE DIAGNOSIS:  Right wrist midcarpal fracture dislocation.  POSTOPERATIVE DIAGNOSIS:  Right wrist midcarpal fracture dislocation.  ATTENDING PHYSICIAN:  Madelynn Done, MD who was scrubbed and present for the entire procedure.  ASSISTANT SURGEON:  None.  SURGICAL PROCEDURES: 1. Right wrist examination under anesthesia. 2. Right wrist manipulation of external fixator and closed     manipulation and reduction of the midcarpal fracture dislocation. 3. Radiographs three-views right wrist.  ANESTHESIA:  General via LMA.  SURGICAL INDICATION:  Ms. Iribe is a 35 year old female who initially underwent application of an external fixator presented back to the office with re-displacement of the fracture dislocation.  The patient is seen and evaluated in the office and recommended to undergo the above procedure.  Risks, benefits, and alternatives were discussed in detail with the patient and signed informed consent was obtained. Risks include, but not limited to bleeding, infection, damage to nearby nerves, arteries, or tendons, loss of motion of the wrist and digits, and need for further surgical intervention.  DESCRIPTION OF PROCEDURE:  The patient was appropriately identified in the preop holding area, mark with permanent marker made on the right wrist to indicate correct operative site.  The patient was brought back to the operating room, placed supine on the anesthesia room table where general anesthesia was administered.  Using the external fixator, the fixator was loosened.  Examination under anesthesia was then carried out of the wrist and I was  able to reduce the wrist.  Closed manipulation was then performed and then using the wrench, the wrist was then held in position and tightened.  All pin sites and clamps were then tightened down.  Following this, radiographs were then obtained showing reduction of the midcarpal joint.  The patient still had the volar fracture off the lunate.  Following this, final radiographs were then obtained. Bolster dressing was applied around the pin sites.  The patient was extubated and taken to recovery room in good condition.  INTRAOPERATIVE RADIOGRAPHS:  Three-views of the wrist do show the reduction of the midcarpal joint, relatively good position of radiocarpal joint.  POSTOPERATIVE PLAN:  The patient admitted overnight for pain control, discharged to the care facility in the morning.  We plan to see her back in approximately 1 week, close followup weekly.  If there is still displacement, further intervention may be required.  We will continue to try to treat this with the external fixator.     Madelynn Done, MD     FWO/MEDQ  D:  08/02/2011  T:  08/02/2011  Job:  [id number]  Electronically Signed by Bradly Bienenstock IV MD on 08/06/2011 09:56:15 AM

## 2011-11-06 ENCOUNTER — Emergency Department (HOSPITAL_COMMUNITY): Payer: Managed Care, Other (non HMO)

## 2011-11-06 ENCOUNTER — Emergency Department (HOSPITAL_COMMUNITY)
Admission: EM | Admit: 2011-11-06 | Discharge: 2011-11-06 | Disposition: A | Discharge status: Home / Self Care | Payer: Managed Care, Other (non HMO) | Attending: Emergency Medicine | Admitting: Emergency Medicine

## 2011-11-06 ENCOUNTER — Encounter (HOSPITAL_COMMUNITY): Payer: Self-pay | Admitting: *Deleted

## 2011-11-06 DIAGNOSIS — F29 Unspecified psychosis not due to a substance or known physiological condition: Secondary | ICD-10-CM | POA: Insufficient documentation

## 2011-11-06 DIAGNOSIS — N390 Urinary tract infection, site not specified: Secondary | ICD-10-CM | POA: Insufficient documentation

## 2011-11-06 DIAGNOSIS — R404 Transient alteration of awareness: Secondary | ICD-10-CM | POA: Insufficient documentation

## 2011-11-06 DIAGNOSIS — G40909 Epilepsy, unspecified, not intractable, without status epilepticus: Secondary | ICD-10-CM | POA: Insufficient documentation

## 2011-11-06 DIAGNOSIS — R51 Headache: Secondary | ICD-10-CM | POA: Insufficient documentation

## 2011-11-06 DIAGNOSIS — R569 Unspecified convulsions: Secondary | ICD-10-CM

## 2011-11-06 LAB — DIFFERENTIAL
Basophils Absolute: 0 10*3/uL (ref 0.0–0.1)
Eosinophils Relative: 0 % (ref 0–5)
Lymphocytes Relative: 18 % (ref 12–46)
Lymphs Abs: 1 10*3/uL (ref 0.7–4.0)

## 2011-11-06 LAB — CBC
HCT: 37.6 % (ref 36.0–46.0)
Hemoglobin: 12.6 g/dL (ref 12.0–15.0)
MCH: 31.4 pg (ref 26.0–34.0)
MCHC: 33.5 g/dL (ref 30.0–36.0)

## 2011-11-06 LAB — URINALYSIS, ROUTINE W REFLEX MICROSCOPIC
Bilirubin Urine: NEGATIVE
Glucose, UA: NEGATIVE mg/dL
Ketones, ur: 40 mg/dL — AB
Nitrite: POSITIVE — AB
Specific Gravity, Urine: 1.021 (ref 1.005–1.030)
pH: 7 (ref 5.0–8.0)

## 2011-11-06 LAB — COMPREHENSIVE METABOLIC PANEL
ALT: 15 U/L (ref 0–35)
AST: 18 U/L (ref 0–37)
CO2: 21 mEq/L (ref 19–32)
Calcium: 9.6 mg/dL (ref 8.4–10.5)
GFR calc non Af Amer: >90 mL/min (ref >90)
Potassium: 4.2 mEq/L (ref 3.5–5.1)
Sodium: 138 mEq/L (ref 135–145)
Total Protein: 7.1 g/dL (ref 6.0–8.3)

## 2011-11-06 LAB — RAPID URINE DRUG SCREEN, HOSP PERFORMED
Amphetamines: NOT DETECTED
Benzodiazepines: NOT DETECTED
Cocaine: NOT DETECTED
Opiates: NOT DETECTED
Tetrahydrocannabinol: POSITIVE — AB

## 2011-11-06 LAB — POCT PREGNANCY, URINE: Preg Test, Ur: NEGATIVE

## 2011-11-06 LAB — URINE MICROSCOPIC-ADD ON

## 2011-11-06 MED ORDER — CEPHALEXIN 500 MG PO CAPS: 500.0000 mg | ORAL_CAPSULE | Freq: Four times a day (QID) | ORAL | Status: AC

## 2011-11-06 MED ORDER — ACETAMINOPHEN 325 MG PO TABS
650.0000 mg | ORAL_TABLET | Freq: Once | ORAL | Status: AC
  Administered 2011-11-06: 650 mg via ORAL
  Filled 2011-11-06: qty 2

## 2011-11-06 NOTE — ED Notes (Signed)
ZOX:WR60<AV> Expected date:11/06/11<BR> Expected time: 7:57 AM<BR> Means of arrival:Ambulance<BR> Comments:<BR> seizure

## 2011-11-06 NOTE — ED Notes (Signed)
Husband reports coming home from work this am , finding pt having focal seizure, incontinence noted. A few minutes later, pt had grand mal seizure, pt bit tongue, bleeding controlled. Pt denies pain in tongue. C/o HA, 10/10. Pt lethargic, disoriented to time and situation.
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
## 2011-11-06 NOTE — ED Provider Notes (Addendum)
History     CSN: [id number] Arrival date & time: 11/06/2011  8:30 AM   First MD Initiated Contact with Patient 11/06/11 [phone]      Chief Complaint  Patient presents with  . Seizures    (Consider location/radiation/quality/duration/timing/severity/associated sxs/prior treatment) HPI Comments: The patient is a 35 year old female with a past medical history of epilepsy for which she takes 400 mg nightly of Dilantin under the care of Dr. Concepcion Elk. Her last seizure was in July of this year. She denies medication noncompliance or dosage change and denies recent illness or head injury. Her husband came home this morning to find the patient amidst a grand mal seizure which she states lasted approximately 5 minutes and then resolved. The patient had begun to regain consciousness and normal mental status in the 30 minutes that followed prior to onset of the second seizure lasting approximately another 5 minutes. The seizures were typical for the patient's history of seizures, grand mal, and she does have bite marks on her tongue from seizure. At the time of evaluation, she is mildly somnolent, but awake, and responsive, oriented appropriately to person, place, time, and event. She reports a mild headache. She denies any focal neurologic deficits, weakness, or numbness.  Patient is a 35 y.o. female presenting with seizures. The history is provided by the patient, the spouse and medical records.  Seizures  This is a recurrent problem. The current episode started 1 to 2 hours ago. The problem has been resolved. Number of times: 2 seizures approximately 30 minutes apart. The most recent episode lasted more than 5 minutes. Associated symptoms include sleepiness, confusion and headaches. Pertinent negatives include no speech difficulty, no visual disturbance, no neck stiffness, no sore throat, no chest pain, no cough, no nausea, no vomiting, no diarrhea and no muscle weakness. Characteristics include eye blinking,  rhythmic jerking, loss of consciousness and bit tongue. Characteristics do not include bowel incontinence, bladder incontinence, apnea or cyanosis. The episode was witnessed. There was no sensation of an aura present. The seizures did not continue in the ED. The seizure(s) had no focality. Possible causes do not include med or dosage change, missed seizure meds or recent illness. There has been no fever. There were no medications administered prior to arrival.    Past Medical History  Diagnosis Date  . Seizures     Past Surgical History  Procedure Date  . Ankle surgery     No family history on file.  History  Substance Use Topics  . Smoking status: Current Everyday Smoker  . Smokeless tobacco: Not on file  . Alcohol Use: Yes    OB History    Grav Para Term Preterm Abortions TAB SAB Ect Mult Living                  Review of Systems  Constitutional: Negative for fever, chills, diaphoresis, activity change, appetite change and fatigue.  HENT: Negative for hearing loss, ear pain, nosebleeds, congestion, sore throat, rhinorrhea, drooling, trouble swallowing, neck pain, neck stiffness, dental problem, voice change, postnasal drip and tinnitus.   Eyes: Negative for photophobia, pain, redness and visual disturbance.  Respiratory: Negative for apnea, cough and shortness of breath.   Cardiovascular: Negative for chest pain, palpitations and cyanosis.  Gastrointestinal: Negative for nausea, vomiting, abdominal pain, diarrhea and bowel incontinence.  Genitourinary: Negative.  Negative for bladder incontinence.  Musculoskeletal: Negative for myalgias, back pain and arthralgias.  Skin: Negative for color change, pallor, rash and wound.  Neurological: Positive for  seizures, loss of consciousness and headaches. Negative for dizziness, tremors, syncope, facial asymmetry, speech difficulty, weakness, light-headedness and numbness.  Hematological: Does not bruise/bleed easily.    Psychiatric/Behavioral: Positive for confusion.    Allergies  Morphine and related  Home Medications   Current Outpatient Rx  Name Route Sig Dispense Refill  . PHENYTOIN SODIUM EXTENDED 100 MG PO CAPS Oral Take 400 mg by mouth daily.        BP 120/76  Pulse 85  Temp(Src) 98.3 F (36.8 C) (Oral)  Resp 20  SpO2 97%  Physical Exam  Nursing note and vitals reviewed. Constitutional: She is oriented to person, place, and time. She appears well-developed and well-nourished. No distress.  HENT:  Head: Normocephalic and atraumatic.  Right Ear: External ear normal.  Left Ear: External ear normal.  Nose: Nose normal.  Mouth/Throat: Oropharynx is clear and moist.  Eyes: Conjunctivae, EOM and lids are normal. Pupils are equal, round, and reactive to light.  Fundoscopic exam:      The right eye shows no hemorrhage and no papilledema.       The left eye shows no hemorrhage and no papilledema.  Neck: Normal range of motion. Neck supple. No JVD present. No tracheal deviation present.  Cardiovascular: Normal rate, regular rhythm, S1 normal, S2 normal, normal heart sounds and intact distal pulses.  Exam reveals no gallop and no friction rub.   No murmur heard. Pulmonary/Chest: Effort normal and breath sounds normal. No stridor. No respiratory distress. She has no wheezes. She has no rales.  Abdominal: Soft. Bowel sounds are normal. She exhibits no distension. There is no tenderness. There is no rebound and no guarding.  Musculoskeletal: Normal range of motion. She exhibits no edema and no tenderness.  Neurological: She is alert and oriented to person, place, and time. She has normal reflexes. She displays no atrophy and no tremor. No cranial nerve deficit or sensory deficit. She exhibits normal muscle tone. She displays no seizure activity. Coordination and gait normal. GCS eye subscore is 4. GCS verbal subscore is 5. GCS motor subscore is 6.  Reflex Scores:      Bicep reflexes are 2+ on the  right side and 2+ on the left side.      Brachioradialis reflexes are 2+ on the right side and 2+ on the left side.      Patellar reflexes are 2+ on the right side and 2+ on the left side.      The patient has a normal funduscopic exam without retinal hemorrhage or papilledema, pupils are equal round and reactive to light appropriately, visual fields are intact to confrontation, extraocular movements are intact and normal, there are no cranial nerve deficits on examination, motor strength in all extremities is appropriate and deep tendon reflexes are symmetrical. The patient has normal coordination by finger-nose-finger test. The neurologic examination is nonfocal.  Skin: Skin is warm and dry. No rash noted. She is not diaphoretic. No erythema. No pallor.  Psychiatric: She has a normal mood and affect. Her behavior is normal. Judgment and thought content normal.    ED Course  Procedures (including critical care time)   Date: 11/06/2011  Rate: 83  Rhythm: normal sinus rhythm  QRS Axis: normal  Intervals: normal  ST/T Wave abnormalities: normal  Conduction Disutrbances:none  Narrative Interpretation: Non-provocative EKG  Old EKG Reviewed: unchanged    Labs Reviewed  POCT CBG MONITORING  COMPREHENSIVE METABOLIC PANEL  CBC  DIFFERENTIAL  URINE RAPID DRUG SCREEN (HOSP PERFORMED)  ETHANOL  PHENYTOIN LEVEL, TOTAL  URINALYSIS, ROUTINE W REFLEX MICROSCOPIC  POCT PREGNANCY, URINE   No results found.   No diagnosis found.  2:44 PM The patient has had no recurrence of seizures while in the emergency department and is awake, alert, and oriented appropriately with no focal neurologic deficits and a normal CT scan of the brain without mass lesion. She has a therapeutic Dilantin level, and has a urinary tract infection. I suspect that the urinary tract infection has lowered her seizure threshold and is responsible for the breakthrough seizure. I sent a urine culture and I will start her on  antibiotics to treat urinary tract infection. The patient is stable for discharge home. I have discussed the findings and patient care plan with the patient who states her understanding of and agreement with the findings and plan.  MDM  Epilepsy with recurrent seizure, subtherapeutic anticonvulsant level, anemia, electrolyte abnormality, arrhythmia, fatigue, infection are all entertained as possible causes of recurrent seizure through a lowering of the seizure threshold. Based on her neurologic examination which is nonfocal and her history of seizures, I do not at present suspect a mass lesion in the brain or see a need for CT imaging.        Felisa Bonier, MD 11/06/11 1610  Felisa Bonier, MD 11/06/11 [id number]  Felisa Bonier, MD 11/06/11 [phone]

## 2011-11-08 LAB — URINE CULTURE

## 2011-11-09 NOTE — ED Notes (Signed)
+   urine Patient treated with keflex-sensitive to same-chart appended per protoocol

## 2012-01-09 ENCOUNTER — Inpatient Hospital Stay (HOSPITAL_COMMUNITY)
Admission: EM | Admit: 2012-01-09 | Discharge: 2012-01-11 | DRG: 101 | Disposition: A | Discharge status: Home / Self Care | Payer: Managed Care, Other (non HMO) | Attending: Internal Medicine | Admitting: Internal Medicine

## 2012-01-09 ENCOUNTER — Emergency Department (HOSPITAL_COMMUNITY): Payer: Managed Care, Other (non HMO)

## 2012-01-09 ENCOUNTER — Encounter (HOSPITAL_COMMUNITY): Payer: Self-pay | Admitting: Emergency Medicine

## 2012-01-09 DIAGNOSIS — K5909 Other constipation: Secondary | ICD-10-CM | POA: Diagnosis present

## 2012-01-09 DIAGNOSIS — R569 Unspecified convulsions: Secondary | ICD-10-CM | POA: Diagnosis present

## 2012-01-09 DIAGNOSIS — F1210 Cannabis abuse, uncomplicated: Secondary | ICD-10-CM | POA: Diagnosis present

## 2012-01-09 DIAGNOSIS — N390 Urinary tract infection, site not specified: Secondary | ICD-10-CM | POA: Diagnosis present

## 2012-01-09 DIAGNOSIS — K5900 Constipation, unspecified: Secondary | ICD-10-CM | POA: Diagnosis present

## 2012-01-09 DIAGNOSIS — A498 Other bacterial infections of unspecified site: Secondary | ICD-10-CM | POA: Diagnosis present

## 2012-01-09 DIAGNOSIS — F17200 Nicotine dependence, unspecified, uncomplicated: Secondary | ICD-10-CM | POA: Diagnosis present

## 2012-01-09 DIAGNOSIS — G40909 Epilepsy, unspecified, not intractable, without status epilepticus: Principal | ICD-10-CM | POA: Diagnosis present

## 2012-01-09 DIAGNOSIS — R5381 Other malaise: Secondary | ICD-10-CM | POA: Diagnosis present

## 2012-01-09 DIAGNOSIS — R45851 Suicidal ideations: Secondary | ICD-10-CM

## 2012-01-09 DIAGNOSIS — R51 Headache: Secondary | ICD-10-CM | POA: Diagnosis present

## 2012-01-09 LAB — DIFFERENTIAL
Basophils Absolute: 0 10*3/uL (ref 0.0–0.1)
Basophils Relative: 0 % (ref 0–1)
Eosinophils Relative: 0 % (ref 0–5)
Lymphocytes Relative: 10 % — ABNORMAL LOW (ref 12–46)
Monocytes Absolute: 0.3 10*3/uL (ref 0.1–1.0)
Neutro Abs: 5.4 10*3/uL (ref 1.7–7.7)

## 2012-01-09 LAB — CBC
HCT: 30 % — ABNORMAL LOW (ref 36.0–46.0)
Hemoglobin: 10.2 g/dL — ABNORMAL LOW (ref 12.0–15.0)
MCHC: 34 g/dL (ref 30.0–36.0)
RDW: 13.8 % (ref 11.5–15.5)
WBC: 6.4 10*3/uL (ref 4.0–10.5)

## 2012-01-09 LAB — URINALYSIS, ROUTINE W REFLEX MICROSCOPIC
Ketones, ur: NEGATIVE mg/dL
Leukocytes, UA: NEGATIVE
Nitrite: POSITIVE — AB
Protein, ur: 100 mg/dL — AB
Urobilinogen, UA: 0.2 mg/dL (ref 0.0–1.0)

## 2012-01-09 LAB — BASIC METABOLIC PANEL
Calcium: 9.1 mg/dL (ref 8.4–10.5)
Chloride: 103 mEq/L (ref 96–112)
Creatinine, Ser: 0.48 mg/dL — ABNORMAL LOW (ref 0.50–1.10)
GFR calc Af Amer: >90 mL/min (ref >90)
Sodium: 135 mEq/L (ref 135–145)

## 2012-01-09 LAB — PREGNANCY, URINE: Preg Test, Ur: NEGATIVE

## 2012-01-09 LAB — URINE MICROSCOPIC-ADD ON

## 2012-01-09 MED ORDER — LORAZEPAM 2 MG/ML IJ SOLN
INTRAMUSCULAR | Status: AC
  Filled 2012-01-09: qty 1

## 2012-01-09 MED ORDER — DEXTROSE 5 % IV SOLN
1.0000 g | INTRAVENOUS | Status: DC
  Filled 2012-01-09 (×2): qty 10

## 2012-01-09 MED ORDER — DEXTROSE 5 % IV SOLN
1.0000 g | Freq: Once | INTRAVENOUS | Status: AC
  Administered 2012-01-09: 1 g via INTRAVENOUS
  Filled 2012-01-09: qty 10

## 2012-01-09 MED ORDER — LEVETIRACETAM 500 MG PO TABS
500.0000 mg | ORAL_TABLET | Freq: Two times a day (BID) | ORAL | Status: DC
  Administered 2012-01-10: 500 mg via ORAL
  Filled 2012-01-09 (×4): qty 1

## 2012-01-09 MED ORDER — DOCUSATE SODIUM 100 MG PO CAPS
100.0000 mg | ORAL_CAPSULE | Freq: Two times a day (BID) | ORAL | Status: DC
  Administered 2012-01-09 – 2012-01-10 (×2): 100 mg via ORAL
  Filled 2012-01-09 (×5): qty 1

## 2012-01-09 MED ORDER — PHENYTOIN SODIUM EXTENDED 100 MG PO CAPS
400.0000 mg | ORAL_CAPSULE | Freq: Every evening | ORAL | Status: DC
  Administered 2012-01-09: 400 mg via ORAL
  Filled 2012-01-09 (×2): qty 4

## 2012-01-09 MED ORDER — HEPARIN SODIUM (PORCINE) 5000 UNIT/ML IJ SOLN
5000.0000 [IU] | Freq: Three times a day (TID) | INTRAMUSCULAR | Status: DC
  Administered 2012-01-10 (×3): 5000 [IU] via SUBCUTANEOUS
  Filled 2012-01-09 (×9): qty 1

## 2012-01-09 MED ORDER — ACETAMINOPHEN 500 MG PO TABS
1000.0000 mg | ORAL_TABLET | Freq: Once | ORAL | Status: AC
  Administered 2012-01-09: 1000 mg via ORAL
  Filled 2012-01-09: qty 2

## 2012-01-09 MED ORDER — ONDANSETRON HCL 4 MG PO TABS: 4.0000 mg | ORAL_TABLET | Freq: Four times a day (QID) | ORAL | Status: DC | PRN

## 2012-01-09 MED ORDER — SODIUM CHLORIDE 0.9 % IV SOLN
1000.0000 mg | Freq: Once | INTRAVENOUS | Status: AC
  Administered 2012-01-09: 1000 mg via INTRAVENOUS
  Filled 2012-01-09: qty 10

## 2012-01-09 MED ORDER — SENNA 8.6 MG PO TABS
1.0000 | ORAL_TABLET | Freq: Two times a day (BID) | ORAL | Status: DC
  Administered 2012-01-09 – 2012-01-10 (×2): 8.6 mg via ORAL
  Filled 2012-01-09 (×3): qty 1

## 2012-01-09 MED ORDER — LORAZEPAM 2 MG/ML IJ SOLN
INTRAMUSCULAR | Status: AC
  Administered 2012-01-09: 2 mg
  Filled 2012-01-09: qty 1

## 2012-01-09 MED ORDER — ACETAMINOPHEN 325 MG PO TABS
650.0000 mg | ORAL_TABLET | Freq: Four times a day (QID) | ORAL | Status: DC | PRN
  Administered 2012-01-10 (×2): 650 mg via ORAL
  Filled 2012-01-09 (×3): qty 2

## 2012-01-09 MED ORDER — LORAZEPAM 2 MG/ML IJ SOLN: 1.0000 mg | Freq: Once | INTRAMUSCULAR | Status: DC

## 2012-01-09 MED ORDER — ONDANSETRON HCL 4 MG/2ML IJ SOLN: 4.0000 mg | Freq: Four times a day (QID) | INTRAMUSCULAR | Status: DC | PRN

## 2012-01-09 MED ORDER — SODIUM CHLORIDE 0.9 % IV SOLN
INTRAVENOUS | Status: DC
  Administered 2012-01-09 – 2012-01-10 (×2): via INTRAVENOUS

## 2012-01-09 MED ORDER — ACETAMINOPHEN 650 MG RE SUPP: 650.0000 mg | Freq: Four times a day (QID) | RECTAL | Status: DC | PRN

## 2012-01-09 MED ORDER — SODIUM CHLORIDE 0.9 % IJ SOLN: 3.0000 mL | Freq: Two times a day (BID) | INTRAMUSCULAR | Status: DC
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
## 2012-01-09 NOTE — ED Notes (Signed)
Tammy from lab collected blood specimens.

## 2012-01-09 NOTE — ED Notes (Signed)
Per EMs pt.  Had seizure witnessed by her husband at home that lasted  About 4 mins. No SOB, denied of pain,

## 2012-01-09 NOTE — ED Provider Notes (Signed)
History     CSN: [id number]  Arrival date & time 01/09/12  1544   First MD Initiated Contact with Patient 01/09/12 1601      Chief Complaint  Patient presents with  . Seizures    Per EMS     (Consider location/radiation/quality/duration/timing/severity/associated sxs/prior treatment) Patient is a 36 y.o. female presenting with seizures. The history is provided by the spouse. The history is limited by the condition of the patient.  Seizures    the patient is a 36 year old female, with a history of epilepsy.  She takes Dilantin for her epilepsy.  However, her husband says that she has been taking less than his prescribed.  He denies recent illness.  The patient is unable to provide a history because she is actively seizing.  Past Medical History  Diagnosis Date  . Seizures     Past Surgical History  Procedure Date  . Ankle surgery     History reviewed. No pertinent family history.  History  Substance Use Topics  . Smoking status: Current Everyday Smoker  . Smokeless tobacco: Not on file  . Alcohol Use: Yes    OB History    Grav Para Term Preterm Abortions TAB SAB Ect Mult Living                  Review of Systems  Unable to perform ROS Neurological: Positive for seizures.    Allergies  Morphine and related  Home Medications   Current Outpatient Rx  Name Route Sig Dispense Refill  . PHENYTOIN SODIUM EXTENDED 100 MG PO CAPS Oral Take 400 mg by mouth daily.        BP 111/85  Pulse 84  Temp(Src) 99.5 F (37.5 C) (Oral)  Resp 19  SpO2 98%  LMP 01/04/2012  Physical Exam  Constitutional: She appears well-developed and well-nourished.       Actively seizing with frothy saliva at her mouth.  HENT:  Head: Normocephalic and atraumatic.  Neck: Normal range of motion. Neck supple.  Cardiovascular:       Tachycardia  Pulmonary/Chest: No respiratory distress. She has rales.       Bilateral rhonchi and rales  Abdominal: Soft. She exhibits no distension.  There is no tenderness.  Musculoskeletal: She exhibits no edema.  Neurological:       Actively seizing, and then postictal confusion  Skin: Skin is warm and dry.  Psychiatric:       Unable to assess because of postictal confusion    ED Course  Procedures (including critical care time) 36 year old lady, with epilepsy, presents with an active seizure.  Her husband denies that she has had a recent illness, but he does state that.  She has not been taking her Dilantin as prescribed.  She was given 2 mg of Ativan and her seizure were controlled.  We will perform laboratory testing, including a Dilantin level.  There is no indication for performance of a CAT scan at this time.  The patient was incontinent of urine while she was seizing.  Labs Reviewed  CBC  DIFFERENTIAL  BASIC METABOLIC PANEL  URINALYSIS, ROUTINE W REFLEX MICROSCOPIC  PREGNANCY, URINE  PHENYTOIN LEVEL, TOTAL   No results found.   No diagnosis found.  Still somnolent. Will admit.   Spoke with the [facility] physician.  He will admit for IV antibiotics and further monitoring.  MDM  Epilepsy uti        Nicholes Stairs, MD 01/09/12 2013

## 2012-01-09 NOTE — ED Notes (Signed)
Phlebotomy aware that RN unable to obtain blood sample.
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
## 2012-01-09 NOTE — ED Notes (Signed)
Pt resting comfortably, no seizure activity noted since arrival to ER.

## 2012-01-09 NOTE — ED Notes (Signed)
Phlebotomy in to attempt blood draw 

## 2012-01-09 NOTE — Progress Notes (Signed)
Pt transferred from emergency department. Pt is alert but very lethargic and has an unsteady gait . Pt has no complaints of pain at this time will continue to monitor. Roosvelt Maser, RN

## 2012-01-10 ENCOUNTER — Encounter (HOSPITAL_COMMUNITY): Payer: Self-pay | Admitting: Neurology

## 2012-01-10 DIAGNOSIS — K5909 Other constipation: Secondary | ICD-10-CM | POA: Diagnosis present

## 2012-01-10 DIAGNOSIS — N390 Urinary tract infection, site not specified: Secondary | ICD-10-CM | POA: Diagnosis present

## 2012-01-10 LAB — HEMOGLOBIN A1C: Hgb A1c MFr Bld: 5.3 % (ref <5.7)

## 2012-01-10 LAB — BASIC METABOLIC PANEL
CO2: 25 mEq/L (ref 19–32)
Calcium: 8.8 mg/dL (ref 8.4–10.5)
Potassium: 3.5 mEq/L (ref 3.5–5.1)
Sodium: 139 mEq/L (ref 135–145)

## 2012-01-10 LAB — CBC
Hemoglobin: 9.7 g/dL — ABNORMAL LOW (ref 12.0–15.0)
MCV: 91.2 fL (ref 78.0–100.0)
Platelets: 212 10*3/uL (ref 150–400)
RBC: 3.18 MIL/uL — ABNORMAL LOW (ref 3.87–5.11)
WBC: 5.7 10*3/uL (ref 4.0–10.5)

## 2012-01-10 LAB — ALBUMIN: Albumin: 3.7 g/dL (ref 3.5–5.2)

## 2012-01-10 LAB — PHENYTOIN LEVEL, TOTAL: Phenytoin Lvl: 16.6 ug/mL (ref 10.0–20.0)

## 2012-01-10 MED ORDER — SENNA 8.6 MG PO TABS: 2.0000 | ORAL_TABLET | Freq: Two times a day (BID) | ORAL | Status: DC

## 2012-01-10 MED ORDER — PHENYTOIN SODIUM EXTENDED 100 MG PO CAPS
300.0000 mg | ORAL_CAPSULE | Freq: Every day | ORAL | Status: DC
  Filled 2012-01-10 (×2): qty 3

## 2012-01-10 MED ORDER — POLYETHYLENE GLYCOL 3350 17 G PO PACK
17.0000 g | PACK | Freq: Every day | ORAL | Status: DC
  Administered 2012-01-10: 17 g via ORAL
  Filled 2012-01-10 (×2): qty 1

## 2012-01-10 MED ORDER — IBUPROFEN 600 MG PO TABS
600.0000 mg | ORAL_TABLET | Freq: Four times a day (QID) | ORAL | Status: DC | PRN
  Administered 2012-01-10: 600 mg via ORAL
  Filled 2012-01-10: qty 1

## 2012-01-10 NOTE — Significant Event (Signed)
I was called to assist our midlevel provider who had been called about this patient wanting to leave AMA and expressing suicidal ideation with a plan to jump in front of a car.   I admitted this patient last night with seizures, which were being treated and evaluated, per the Neurology notes from today that I have reviewed.   Per review of EPIC, it seems the patient wanted to leave AMA bc she was having a hard time going off the floor to smoke, however around 5-6pm when nursing was going in to have her sign AMA papers, she was distraught and expressed the desire to hurt herself, jump in front of a bus.   At this point, given documented suicidal ideation, we initiated involuntary commitment paperwork which is currently underway.   Per my discussion with the pt, she is currently denying any current any prior suicidal ideation, she states she never made those statements, she has no desire to hurt herself or anyone else, and if she left the hospital she would go get on the bus and go "somewhere." She is quite Armed forces technical officer.   At this point, I think she is probably just irate and was possibly speaking out of frustration and anger, but given what sounds like true SI earlier tonight that is well documented, I do not feel comfortable clearing her on my own, or letting her leave the hospital. There is a Emergency planning/management officer in the room and the paperwork for involuntary commitment is underway. I have asked our midlevel provider to ensure that a psychiatric consultation will be initiated to help evaluate.

## 2012-01-10 NOTE — Progress Notes (Signed)
Pt requesting to leave due to the fact that she is unable to go downstairs to smoke. Pt have been informed of the hospital policies that this is a smoke free facility as well as she has been advised that we can not view her telelmetry when she is off of the floor and she has to have a doctors order to even go off the floor. We have offered pt a nicotine patch which she has refused. Pt has removed her telemetry box and is on the phone calling her husband to argue because she states that he has not left her any more cigarettes. MD notified.

## 2012-01-10 NOTE — Progress Notes (Addendum)
MEDICATION RELATED CONSULT NOTE - INITIAL   Pharmacy Consult for Dilantin Monitoring Assistance Indication: Seizures  Allergies  Allergen Reactions  . Morphine And Related     Patient Measurements: Height: 5' 2.5" (158.8 cm) Weight: 178 lb 12.7 oz (81.1 kg) IBW/kg (Calculated) : 51.25    Vital Signs: Temp: 97.8 F (36.6 C) (02/06 0512) Temp src: Oral (02/06 0512) BP: 116/74 mmHg (02/06 0512) Pulse Rate: 69  (02/06 0512) Intake/Output from previous day: 02/05 0701 - 02/06 0700 In: 1250 [I.V.:900] Out: 825 [Urine:825] Intake/Output from this shift:    Labs:  Basename 01/10/12 0918 01/10/12 0405 01/09/12 1750  WBC -- 5.7 6.4  HGB -- 9.7* 10.2*  HCT -- 29.0* 30.0*  PLT -- 212 PLATELET CLUMPS NOTED ON SMEAR  APTT -- -- --  CREATININE -- 0.54 0.48*  LABCREA -- -- --  CREATININE -- 0.54 0.48*  CREAT24HRUR -- -- --  MG -- -- --  PHOS -- -- --  ALBUMIN 3.7 -- --  PROT -- -- --  ALBUMIN 3.7 -- --  AST -- -- --  ALT -- -- --  ALKPHOS -- -- --  BILITOT -- -- --  BILIDIR -- -- --  IBILI -- -- --   Estimated Creatinine Clearance: 97 ml/min (by C-G formula based on Cr of 0.54).   Microbiology: No results found for this or any previous visit (from the past 720 hour(s)).  Medical History: Past Medical History  Diagnosis Date  . Seizures     Medications:  Scheduled:    . acetaminophen  1,000 mg Oral Once  . cefTRIAXone (ROCEPHIN)  IV  1 g Intravenous Once  . cefTRIAXone (ROCEPHIN)  IV  1 g Intravenous Q24H  . docusate sodium  100 mg Oral BID  . heparin  5,000 Units Subcutaneous Q8H  . levetiracetam  1,000 mg Intravenous Once  . levETIRAcetam  500 mg Oral BID  . LORazepam      . phenytoin  300 mg Oral QHS  . senna  1 tablet Oral BID  . sodium chloride  3 mL Intravenous Q12H  . DISCONTD: LORazepam  1 mg Intravenous Once  . DISCONTD: phenytoin  400 mg Oral QPM   Infusions:    . sodium chloride 100 mL/hr at 01/10/12 0700   PRN: acetaminophen,  acetaminophen, ondansetron (ZOFRAN) IV, ondansetron  Assessment: 36 yo F with known seizure disorder. Currently receiving Dilantin 300mg  PO QHS + Keppra 500 mg PO BID (did receive 1g IV Keppra 2/5 pm) - dosed per Neuro. Home dose reportedly 450mg  Dilantin. Asked to assist with Dilantin level monitoring. Albumin = 3.7, SCr=0.54, Phenytoin level (total)= 11.7 (on 2/5).  Goal of Therapy:  Phenytoin level (total) 10-20.  Plan:  1)  Will continue current Dilantin regimen  2)  F/U Total phenytoin level from this am (in process) 3)  Will continue to follow pt while in hospital with neurology.  Bria Sparr, Cathlean Cower 01/10/2012,10:35 AM   ADDENDUM: Repeat Dilantin level this morning = 16.6, still in goal range 10-20 (normal albumin and SCr). Continue current Dilantin as previously ordered by neuro.  Darrol Angel, PharmD 01/10/2012 12:54 PM

## 2012-01-10 NOTE — Progress Notes (Signed)
Went to give pt AMA papers to leave against medical advice and pt began to cry stating that she was tired of going through what she has been going through with her disease process (having seizures). Stating that they have been getting worse and she has been trying to deal with the situation for years and that she is ready to end it all and walk out in front of a car. States that no one will miss her, not her husband or her kids because they are almost grown. She kept saying that she doesn't want to live anymore. States that she wish that her husband would have just left her at home to die and stop bringing her to the hospital. Pt refuses to put her telemetry back on and also added that when she has been to [facility] that the nurses there have let her take off her telemetry and go outside to smoke. I tried to consol pt while telling her the rules of the hospital. I also informed her that I don't feel safe to let her go home AMA because of the things that she has said to me. Pt stated that she understood what I was saying to her about fearing for her safety and that it was my job and responsibility to make sure that she was safe and she was willing to comply with me and she nodded her head while continuing to cry. Pt husband called and was told that he was not able to come and pick pt up due to me fearing for her safety and I told the pt to talk to her husband if she wishes about my concerns. Charge RN, [facility], and MD notified; Recruitment consultant at the bedside. Pt husband called to the desk and asked if his wife can have something for anxiety.

## 2012-01-10 NOTE — Progress Notes (Signed)
Pt attempting to leave the department to go outside to smoke with the telemetry monitor in place.  Pt instructed that she could not leave the department while being monitored.  Stated she had a MD order, verified with physician who was on the department, and he did not give an order to leave the floor.  Instructed patient and visitor that she did not have an order to leave the department but she stated she was going outside to smoke.  Informed pt and visitor that the hospital would not take responsibility for her if she left the department and that we are a non-smoking facility.  Pt acknowledged and stated she understood and then proceeded to get on the elevator.  Informed the patient's nurse to document when the patient left the department against medical advice. Nino Parsley
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
## 2012-01-10 NOTE — Progress Notes (Signed)
Monitor tech called me to tell me that patient was no longer on the monitor.  When I went into patient's room, she was pulling off her telemetry and about to remove her IV. When I asked her what was going on, she said "I'm leaving this place."  When I asked her the reason, she said that she "didn't want to be here anymore." I explained to her that the doctor hasn't discharged her and that she would need to sign AMA papers if she was serious about leaving.  I informed her that insurance would not pay if she left AMA and she said "they won't pay anyways so it doesn't matter."  I've tried to express to the patient staying another night so she can see the doctor in the morning since the MD told her she would probably be discharged tomorrow but she wasn't interested.  Will continue to monitor this situation and have patient sign AMA papers if she is going to leave. Second RN notified MD for me.  Mackinley Cassaday, Joslyn Devon

## 2012-01-10 NOTE — Progress Notes (Signed)
UR complete 

## 2012-01-10 NOTE — Progress Notes (Addendum)
Upon arriving on shift tonight, pt was irrate and wanting to leave AMA. However, due to expressing suicidal ideation, pt is unable has been involuntarily committed. Pt continues to be irrate and is refusing all medical care including seizure meds. Pt refuses to be assessed by RN for shift assessment and stated that she,"don't want any medicine that you have to give either." She also stated, "I know that your just asking because it's your job, but no, I don't want anything." Pt also refuses to wear telemetry box and has no IV access and refuses for a new site to be started. Pt continues to wear her own clothes and refuses to change into hospital required attire. [facility], MD and charge nurse are aware of this. Will continue to monitor pt. Mechele Collin

## 2012-01-10 NOTE — Progress Notes (Signed)
DAILY PROGRESS NOTE                              GENERAL INTERNAL MEDICINE [facility]  SUBJECTIVE: Husband and at bedside. Denies any specific complaints.  OBJECTIVE: BP 116/74  Pulse 69  Temp(Src) 97.8 F (36.6 C) (Oral)  Resp 20  Ht 5' 2.5" (1.588 m)  Wt 81.1 kg (178 lb 12.7 oz)  BMI 32.18 kg/m2  SpO2 100%  LMP 12/24/2011  Intake/Output Summary (Last 24 hours) at 01/10/12 1049 Last data filed at 01/10/12 0700  Gross per 24 hour  Intake   1250 ml  Output    825 ml  Net    425 ml                      Weight change:  Physical Exam: General: Alert and awake oriented x3 not in any acute distress. HEENT: anicteric sclera, pupils equal reactive to light and accommodation CVS: S1-S2 heard, no murmur rubs or gallops Chest: clear to auscultation bilaterally, no wheezing rales or rhonchi Abdomen:  normal bowel sounds, soft, nontender, nondistended, no organomegaly Neuro: Cranial nerves II-XII intact, no focal neurological deficits Extremities: no cyanosis, no clubbing or edema noted bilaterally   Lab Results:  Basename 01/10/12 0405 01/09/12 1750  NA 139 135  K 3.5 4.2  CL 105 103  CO2 25 22  GLUCOSE 81 93  BUN 5* 7  CREATININE 0.54 0.48*  CALCIUM 8.8 9.1  MG -- --  PHOS -- --    Basename 01/10/12 0918  AST --  ALT --  ALKPHOS --  BILITOT --  PROT --  ALBUMIN 3.7   No results found for this basename: LIPASE:2,AMYLASE:2 in the last 72 hours  Basename 01/10/12 0405 01/09/12 1750  WBC 5.7 6.4  NEUTROABS -- 5.4  HGB 9.7* 10.2*  HCT 29.0* 30.0*  MCV 91.2 90.4  PLT 212 PLATELET CLUMPS NOTED ON SMEAR   Micro Results: No results found for this or any previous visit (from the past 240 hour(s)).  Studies/Results: Dg Chest Port 1 View  01/09/2012  *RADIOLOGY REPORT*  Clinical Data: Seizure.  Vomiting.  Rule out aspiration.  PORTABLE CHEST - 1 VIEW  Comparison: 07/18/2009  Findings: The heart size and mediastinal contours are within normal limits.  Both  lungs are clear.  The visualized skeletal structures are unremarkable.  IMPRESSION: Negative exam.  Original Report Authenticated By: Rosealee Albee, M.D.   Medications: Scheduled Meds:   . acetaminophen  1,000 mg Oral Once  . cefTRIAXone (ROCEPHIN)  IV  1 g Intravenous Once  . cefTRIAXone (ROCEPHIN)  IV  1 g Intravenous Q24H  . docusate sodium  100 mg Oral BID  . heparin  5,000 Units Subcutaneous Q8H  . levetiracetam  1,000 mg Intravenous Once  . levETIRAcetam  500 mg Oral BID  . LORazepam      . phenytoin  300 mg Oral QHS  . senna  1 tablet Oral BID  . sodium chloride  3 mL Intravenous Q12H  . DISCONTD: LORazepam  1 mg Intravenous Once  . DISCONTD: phenytoin  400 mg Oral QPM   Continuous Infusions:   . sodium chloride 100 mL/hr at 01/10/12 0700   PRN Meds:.acetaminophen, acetaminophen, ondansetron (ZOFRAN) IV, ondansetron  ASSESSMENT & PLAN: Principal Problem:  *Seizures Active Problems:  UTI (lower urinary tract infection)  Seizure Patient being seen by neurology, recommendation to add Keppra to  Dilantin. No seizure-like activity this morning we'll keep for another night to observe for any seizure-like activity.  UTI Urinalysis consistent with UTI, patient is on Rocephin for now. Will see if the culture comes back in the morning and we'll adjust the antibiotics accordingly.  Chronic constipation Patient said she might have on bowel movement every week. She's been like this for some time. Patient is getting MiraLax at home will continue that. Patient will likely need referral to gastroenterologist for evaluation as outpatient.   LOS: 1 day   Preeti Winegardner A 01/10/2012, 10:49 AM

## 2012-01-10 NOTE — Progress Notes (Signed)
[facility] PROGRESS NOTE    SUBJECTIVE   In speaking with patient she states she has been on dilantin for 4 years without chest pain, only in the  few month when she was bumped up to 450 and 500 mg doses did she experience chest pain.  She states the dilantin has kept her fairly well managed and seizure free.  She cannot tell me what her levels are at baseline or even when her last Dilantin level was checked. She does have a out patient neurologist in [facility] she sees on a regular bases.  OBJECTIVE   Vital signs in last 24 hours: Temp:  [97.8 F (36.6 C)-99.5 F (37.5 C)] 97.8 F (36.6 C) (02/06 0512) Pulse Rate:  [69-94] 69  (02/06 0512) Resp:  [12-23] 20  (02/06 0512) BP: (105-125)/(62-88) 116/74 mmHg (02/06 0512) SpO2:  [98 %-100 %] 100 % (02/06 0512) Weight:  [81.1 kg (178 lb 12.7 oz)] 81.1 kg (178 lb 12.7 oz) (02/05 2232)  Intake/Output from previous day: 02/05 0701 - 02/06 0700 In: 1250 [I.V.:900] Out: 825 [Urine:825] Intake/Output this shift:   Nutritional status: General  Past Medical History  Diagnosis Date  . Seizures     Neurologic Exam:  Mental Status: Alert, oriented, thought content appropriate.  Speech fluent without evidence of aphasia. Able to follow 3 step commands without difficulty. Cranial Nerves: II-Visual fields grossly intact. III/IV/VI-Extraocular movements intact.  Pupils reactive bilaterally. V/VII-Smile symmetric VIII-grossly intact IX/X-normal gag XI-bilateral shoulder shrug XII-midline tongue extension Motor: 5/5 bilaterally with normal tone and bulk Sensory: Pinprick and light touch intact throughout, bilaterally Deep Tendon Reflexes: 2+ and symmetric throughout Plantars: Downgoing bilaterally Cerebellar: Normal finger-to-nose, normal rapid alternating movements and normal heel-to-shin test.    Lab Results: No results found for this basename: cbc, bmp, coags, chol, tri, ldl,  hga1c   Lipid Panel No results found for this basename: CHOL,TRIG,HDL,CHOLHDL,VLDL,LDLCALC in the last 72 hours  Studies/Results: Dg Chest Port 1 View  01/09/2012  *RADIOLOGY REPORT*  Clinical Data: Seizure.  Vomiting.  Rule out aspiration.  PORTABLE CHEST - 1 VIEW  Comparison: 07/18/2009  Findings: The heart size and mediastinal contours are within normal limits.  Both lungs are clear.  The visualized skeletal structures are unremarkable.  IMPRESSION: Negative exam.  Original Report Authenticated By: Rosealee Albee, M.D.    Medications:     Scheduled:   . acetaminophen  1,000 mg Oral Once  . cefTRIAXone (ROCEPHIN)  IV  1 g Intravenous Once  . cefTRIAXone (ROCEPHIN)  IV  1 g Intravenous Q24H  . docusate sodium  100 mg Oral BID  . heparin  5,000 Units Subcutaneous Q8H  . levetiracetam  1,000 mg Intravenous Once  . levETIRAcetam  500 mg Oral BID  . LORazepam      . phenytoin  300 mg Oral QHS  . senna  1 tablet Oral BID  . sodium chloride  3 mL Intravenous Q12H  . DISCONTD: LORazepam  1 mg Intravenous Once  . DISCONTD: phenytoin  400 mg Oral QPM    Assessment/Plan:    Patient Active Hospital Problem List: Seizures    Assessment: 36 YO female with known seizure disorder. Patient has been on 400 mg q day without chest pain. Patient currently on 300 mg q  HS without chest pain.  She comes in with breakthrough seizure in setting of UTI.  Due to patient doing well on 400 mg daily without chest pain and seizure in setting of infection.  Would keep patient on 300mg  q HS and not titrate off of dilantin.  Agree with continuing with 500 mg Keppra BID in addition for now.  Patient will need to follow up with her out patient neurologist in [place] to further adjust her AED.    I have discussed with Dr. Roseanne Reno and he agrees with the above mentioned.     Felicie Morn PA-C [facility] [phone]  01/10/2012, 8:37 AM

## 2012-01-11 DIAGNOSIS — R569 Unspecified convulsions: Secondary | ICD-10-CM

## 2012-01-11 DIAGNOSIS — R45851 Suicidal ideations: Secondary | ICD-10-CM

## 2012-01-11 LAB — URINE CULTURE
Colony Count: >=100000
Culture  Setup Time: 201302060141

## 2012-01-11 MED ORDER — CIPROFLOXACIN HCL 500 MG PO TABS: 500.0000 mg | ORAL_TABLET | Freq: Two times a day (BID) | ORAL | Status: AC

## 2012-01-11 MED ORDER — LEVETIRACETAM 500 MG PO TABS: 500.0000 mg | ORAL_TABLET | Freq: Two times a day (BID) | ORAL | Status: DC

## 2012-01-11 NOTE — Progress Notes (Signed)
DAILY PROGRESS NOTE                              GENERAL INTERNAL MEDICINE [facility]  SUBJECTIVE: Patient is sitting on chair wearing her regular clothes, saying that she is ready to go.  OBJECTIVE: BP 119/82  Pulse 67  Temp(Src) 98 F (36.7 C) (Oral)  Resp 20  Ht 5' 2.5" (1.588 m)  Wt 81.1 kg (178 lb 12.7 oz)  BMI 32.18 kg/m2  SpO2 98%  LMP 12/24/2011  Intake/Output Summary (Last 24 hours) at 01/11/12 0857 Last data filed at 01/11/12 0330  Gross per 24 hour  Intake    720 ml  Output      0 ml  Net    720 ml                      Weight change:  Physical Exam: General: Alert and awake oriented x3 not in any acute distress. HEENT: anicteric sclera, pupils equal reactive to light and accommodation CVS: S1-S2 heard, no murmur rubs or gallops Chest: clear to auscultation bilaterally, no wheezing rales or rhonchi Abdomen:  normal bowel sounds, soft, nontender, nondistended, no organomegaly Neuro: Cranial nerves II-XII intact, no focal neurological deficits Extremities: no cyanosis, no clubbing or edema noted bilaterally   Lab Results:  Basename 01/10/12 0405 01/09/12 1750  NA 139 135  K 3.5 4.2  CL 105 103  CO2 25 22  GLUCOSE 81 93  BUN 5* 7  CREATININE 0.54 0.48*  CALCIUM 8.8 9.1  MG -- --  PHOS -- --    Basename 01/10/12 0918  AST --  ALT --  ALKPHOS --  BILITOT --  PROT --  ALBUMIN 3.7   No results found for this basename: LIPASE:2,AMYLASE:2 in the last 72 hours  Basename 01/10/12 0405 01/09/12 1750  WBC 5.7 6.4  NEUTROABS -- 5.4  HGB 9.7* 10.2*  HCT 29.0* 30.0*  MCV 91.2 90.4  PLT 212 PLATELET CLUMPS NOTED ON SMEAR   Micro Results: Recent Results (from the past 240 hour(s))  URINE CULTURE     Status: Normal (Preliminary result)   Collection Time   01/09/12  4:17 PM      Component Value Range Status Comment   Specimen Description URINE, RANDOM   Final    Special Requests NONE   Final    Culture  Setup Time [id number]   Final    Colony  Count >=100,000 COLONIES/ML   Final    Culture ESCHERICHIA COLI   Final    Report Status PENDING   Incomplete     Studies/Results: Dg Chest Port 1 View  01/09/2012  *RADIOLOGY REPORT*  Clinical Data: Seizure.  Vomiting.  Rule out aspiration.  PORTABLE CHEST - 1 VIEW  Comparison: 07/18/2009  Findings: The heart size and mediastinal contours are within normal limits.  Both lungs are clear.  The visualized skeletal structures are unremarkable.  IMPRESSION: Negative exam.  Original Report Authenticated By: Rosealee Albee, M.D.   Medications: Scheduled Meds:    . cefTRIAXone (ROCEPHIN)  IV  1 g Intravenous Q24H  . docusate sodium  100 mg Oral BID  . heparin  5,000 Units Subcutaneous Q8H  . levETIRAcetam  500 mg Oral BID  . phenytoin  300 mg Oral QHS  . polyethylene glycol  17 g Oral Daily  . senna  2 tablet Oral BID  . sodium chloride  3 mL  Intravenous Q12H  . DISCONTD: senna  1 tablet Oral BID   Continuous Infusions:    . sodium chloride 100 mL/hr at 01/10/12 1341   PRN Meds:.acetaminophen, acetaminophen, ibuprofen, ondansetron (ZOFRAN) IV, ondansetron  ASSESSMENT & PLAN: Principal Problem:  *Seizures Active Problems:  UTI (lower urinary tract infection)  Chronic constipation  Seizure Patient being seen by neurology, recommendation to add Keppra to Dilantin. No seizure-like activity this morning we'll keep for another night to observe for any seizure-like activity.  UTI Urinalysis consistent with UTI, patient is on Rocephin for now. Will see if the culture comes back in the morning and we'll adjust the antibiotics accordingly.  Chronic constipation Patient said she might have on bowel movement every week. She's been like this for some time. Patient is getting MiraLax at home will continue that. Patient will likely need referral to gastroenterologist for evaluation as outpatient.  Suicidal ideation Patient yesterday decided to leave AMA, then she the nursing staff that she  is suicidal. Patient admitted involuntarily, there is a Comptroller for safety at bedside. We asked psychiatry to evaluate her for disposition for either to go home or to inpatient psychiatry ward,  she is medically stable for discharge.  LOS: 2 days   Harlow Carrizales A 01/11/2012, 8:57 AM
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
## 2012-01-11 NOTE — Progress Notes (Signed)
Pt very angry stating that the nurse yesterday was lying and that she did not say she wanted to harm herself.  Assured her that her safety was our primary concern and her physician had consulted psychiatry.  Informed her that psychiatry must clear her for discharge and she must stay with [place] until he does.  Pt stated that we were all "crazy white people" and that she would never come back to this hospital again.  Attempted to communicate with patient further and she stated she just wanted [place] to leave.  Patient experience office called. Nino Parsley
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
## 2012-01-11 NOTE — Progress Notes (Signed)
Pt refuses to be examined by nurse.  Has blanket pulled over head and refuses to remove it.  States that she does not wish to talk to nurse or anyone but her husband.  Will continue to monitor.  Safety sitter present.

## 2012-01-11 NOTE — Progress Notes (Signed)
Pt refused to sign discharge papers or receive any d/c instructions.  Copy of discharge instructions and prescriptions given to pt.
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
## 2012-01-11 NOTE — Progress Notes (Signed)
  Pharmacy Note (Brief) Dilantin OutPatient Monitoring  Pt currently on Dilantin ER 300mg  PO QHS (plus Keppra 500mg  PO BID) - both dosed by Neurology. Awaiting discharge clearance. Since Dilantin dose has changed from home regimen:  Suggest: 1) Checking SCr, albumin, and total phenytoin level as outpatient between 2/11-2/13/13 to assess trend. 2) Recheck SCr, albumin, and total phenytoin level 2/18-2/20 to assess new steady state level  Goal is to maintain Phenytoin level 10-20.  Darrol Angel, PharmD [phone] 01/11/2012 1:53 PM

## 2012-01-11 NOTE — Progress Notes (Signed)
Pt was calm through the night. Pt conversed with the safety sitter all night and had no further outbursts. Pt continued to refuse all meds and did not want any other staff in her room. [employer] arrived and served the pt with the IVC order. Pt was compliant with the officer and acknowledged what was told to her.

## 2012-01-11 NOTE — Progress Notes (Signed)
                                       [facility] PRO[facility]ESS [facility]    SUBJECTIVE   Patient sitting comfortably in a chair. No complaints. No new seizures.  I have had a loong conversation with her about plan to keep her on dilantin and keppra until she follows up out patient.  She understands and is in agreement.  OBJECTIVE   Past Medical History  Diagnosis Date  . Seizures     Neurologic Exam:   Mental Status: Alert, oriented, thought content appropriate.  Speech fluent without evidence of aphasia. Able to follow 3 step commands without difficulty. Cranial Nerves: II-Visual fields grossly intact. III/IV/VI-Extraocular movements intact.  Pupils reactive bilaterally. V/VII-Smile symmetric VIII-grossly intact IX/X-normal gag XI-bilateral shoulder shrug XII-midline tongue extension Motor: 5/5 bilaterally with normal tone and bulk Sensory: Pinprick and light touch intact throughout, bilaterally Deep Tendon Reflexes: 2+ and symmetric throughout Plantars: Downgoing bilaterally Cerebellar: Normal finger-to-nose, normal rapid alternating movements and normal heel-to-shin test.  Normal gait and station.  Lab Results: Lipid Panel  Medications:   Scheduled:   . cefTRIAXone (ROCEPHIN)  IV  1 g Intravenous Q24H  . docusate sodium  100 mg Oral BID  . heparin  5,000 Units Subcutaneous Q8H  . levETIRAcetam  500 mg Oral BID  . phenytoin  300 mg Oral QHS  . polyethylene glycol  17 g Oral Daily  . senna  2 tablet Oral BID  . sodium chloride  3 mL Intravenous Q12H  . DISCONTD: senna  1 tablet Oral BID    Assessment/Plan:    Patient Active Hospital Problem List: Seizures ()   Assessment: No further seizure activity.  Dilantin level is 16.6 and patient has no complaints of chest discomfort.  She is tolerating the Keppra well and has no complaints of SE.  Break through seizure most likely secondary to underlying infection.    Plan: Continue current plan of Keppra and  Dilantin.  Have follow up out patient with neurology to further adjust medications.  Neurology will sign off at this time.  Please call with any questions.     Felicie Morn PA-C [facility] [phone]  01/11/2012, 8:29 AM

## 2012-05-28 ENCOUNTER — Inpatient Hospital Stay (HOSPITAL_COMMUNITY): Payer: Managed Care, Other (non HMO)

## 2012-05-28 ENCOUNTER — Encounter (HOSPITAL_COMMUNITY): Payer: Self-pay | Admitting: *Deleted

## 2012-05-28 ENCOUNTER — Emergency Department (HOSPITAL_COMMUNITY): Payer: Managed Care, Other (non HMO)

## 2012-05-28 ENCOUNTER — Inpatient Hospital Stay (HOSPITAL_COMMUNITY)
Admission: EM | Admit: 2012-05-28 | Discharge: 2012-05-29 | DRG: 101 | Discharge status: Against Medical Advice | Payer: Managed Care, Other (non HMO) | Attending: Internal Medicine | Admitting: Internal Medicine

## 2012-05-28 DIAGNOSIS — E876 Hypokalemia: Secondary | ICD-10-CM | POA: Diagnosis present

## 2012-05-28 DIAGNOSIS — K5909 Other constipation: Secondary | ICD-10-CM | POA: Diagnosis present

## 2012-05-28 DIAGNOSIS — R569 Unspecified convulsions: Secondary | ICD-10-CM | POA: Diagnosis present

## 2012-05-28 DIAGNOSIS — K5900 Constipation, unspecified: Secondary | ICD-10-CM

## 2012-05-28 DIAGNOSIS — G40209 Localization-related (focal) (partial) symptomatic epilepsy and epileptic syndromes with complex partial seizures, not intractable, without status epilepticus: Secondary | ICD-10-CM

## 2012-05-28 DIAGNOSIS — D500 Iron deficiency anemia secondary to blood loss (chronic): Secondary | ICD-10-CM | POA: Diagnosis present

## 2012-05-28 DIAGNOSIS — Z9119 Patient's noncompliance with other medical treatment and regimen: Secondary | ICD-10-CM

## 2012-05-28 DIAGNOSIS — Z91199 Patient's noncompliance with other medical treatment and regimen due to unspecified reason: Secondary | ICD-10-CM

## 2012-05-28 HISTORY — DX: Other constipation: K59.09

## 2012-05-28 LAB — URINALYSIS, ROUTINE W REFLEX MICROSCOPIC
Glucose, UA: NEGATIVE mg/dL
Hgb urine dipstick: NEGATIVE
Leukocytes, UA: NEGATIVE
Protein, ur: 30 mg/dL — AB
Specific Gravity, Urine: 1.022 (ref 1.005–1.030)
Urobilinogen, UA: 0.2 mg/dL (ref 0.0–1.0)

## 2012-05-28 LAB — BASIC METABOLIC PANEL
CO2: 18 mEq/L — ABNORMAL LOW (ref 19–32)
Calcium: 9.3 mg/dL (ref 8.4–10.5)
GFR calc Af Amer: >90 mL/min (ref >90)
GFR calc non Af Amer: >90 mL/min (ref >90)
Sodium: 136 mEq/L (ref 135–145)

## 2012-05-28 LAB — CBC
HCT: 33.7 % — ABNORMAL LOW (ref 36.0–46.0)
MCV: 85.5 fL (ref 78.0–100.0)
Platelets: 254 10*3/uL (ref 150–400)
RBC: 3.67 MIL/uL — ABNORMAL LOW (ref 3.87–5.11)
RBC: 3.94 MIL/uL (ref 3.87–5.11)
RDW: 17.7 % — ABNORMAL HIGH (ref 11.5–15.5)
WBC: 10.4 10*3/uL (ref 4.0–10.5)
WBC: 11.4 10*3/uL — ABNORMAL HIGH (ref 4.0–10.5)

## 2012-05-28 LAB — RAPID URINE DRUG SCREEN, HOSP PERFORMED
Amphetamines: NOT DETECTED
Benzodiazepines: POSITIVE — AB
Opiates: NOT DETECTED

## 2012-05-28 LAB — MRSA PCR SCREENING: MRSA by PCR: NEGATIVE

## 2012-05-28 LAB — MAGNESIUM: Magnesium: 1.8 mg/dL (ref 1.5–2.5)

## 2012-05-28 LAB — PHENYTOIN LEVEL, TOTAL: Phenytoin Lvl: <2.5 ug/mL — ABNORMAL LOW (ref 10.0–20.0)

## 2012-05-28 LAB — GLUCOSE, CAPILLARY: Glucose-Capillary: 107 mg/dL — ABNORMAL HIGH (ref 70–99)

## 2012-05-28 LAB — DIFFERENTIAL
Basophils Absolute: 0 10*3/uL (ref 0.0–0.1)
Eosinophils Relative: 0 % (ref 0–5)
Monocytes Relative: 3 % (ref 3–12)
Neutrophils Relative %: 92 % — ABNORMAL HIGH (ref 43–77)

## 2012-05-28 LAB — URINE MICROSCOPIC-ADD ON

## 2012-05-28 LAB — TSH: TSH: 0.279 u[IU]/mL — ABNORMAL LOW (ref 0.350–4.500)

## 2012-05-28 LAB — CREATININE, SERUM: GFR calc Af Amer: >90 mL/min (ref >90)

## 2012-05-28 MED ORDER — LEVETIRACETAM 500 MG PO TABS
500.0000 mg | ORAL_TABLET | Freq: Two times a day (BID) | ORAL | Status: DC
  Administered 2012-05-29: 500 mg via ORAL
  Filled 2012-05-28 (×2): qty 1

## 2012-05-28 MED ORDER — ALUM & MAG HYDROXIDE-SIMETH 200-200-20 MG/5ML PO SUSP: 30.0000 mL | Freq: Four times a day (QID) | ORAL | Status: DC | PRN

## 2012-05-28 MED ORDER — KETOROLAC TROMETHAMINE 30 MG/ML IJ SOLN
30.0000 mg | Freq: Once | INTRAMUSCULAR | Status: AC
  Administered 2012-05-28: 30 mg via INTRAVENOUS

## 2012-05-28 MED ORDER — ONDANSETRON HCL 4 MG/2ML IJ SOLN: 4.0000 mg | Freq: Three times a day (TID) | INTRAMUSCULAR | Status: AC | PRN

## 2012-05-28 MED ORDER — SODIUM CHLORIDE 0.9 % IV SOLN: INTRAVENOUS | Status: DC

## 2012-05-28 MED ORDER — ONDANSETRON HCL 4 MG/2ML IJ SOLN: 4.0000 mg | Freq: Four times a day (QID) | INTRAMUSCULAR | Status: DC | PRN

## 2012-05-28 MED ORDER — PANTOPRAZOLE SODIUM 40 MG IV SOLR
40.0000 mg | INTRAVENOUS | Status: DC
  Administered 2012-05-28: 40 mg via INTRAVENOUS
  Filled 2012-05-28 (×2): qty 40

## 2012-05-28 MED ORDER — METOCLOPRAMIDE HCL 5 MG/ML IJ SOLN
10.0000 mg | Freq: Once | INTRAMUSCULAR | Status: AC
  Administered 2012-05-28: 10 mg via INTRAVENOUS
  Filled 2012-05-28: qty 2

## 2012-05-28 MED ORDER — SODIUM CHLORIDE 0.9 % IV SOLN
250.0000 mg | Freq: Two times a day (BID) | INTRAVENOUS | Status: DC
  Administered 2012-05-28: 250 mg via INTRAVENOUS
  Filled 2012-05-28 (×5): qty 5

## 2012-05-28 MED ORDER — SODIUM CHLORIDE 0.9 % IV SOLN
500.0000 mg | Freq: Once | INTRAVENOUS | Status: DC
  Filled 2012-05-28: qty 5

## 2012-05-28 MED ORDER — BISACODYL 5 MG PO TBEC
10.0000 mg | DELAYED_RELEASE_TABLET | Freq: Every day | ORAL | Status: DC | PRN
  Filled 2012-05-28: qty 2

## 2012-05-28 MED ORDER — DIPHENHYDRAMINE HCL 50 MG/ML IJ SOLN
INTRAMUSCULAR | Status: AC
  Filled 2012-05-28: qty 1

## 2012-05-28 MED ORDER — SENNOSIDES-DOCUSATE SODIUM 8.6-50 MG PO TABS
1.0000 | ORAL_TABLET | Freq: Every evening | ORAL | Status: DC | PRN
  Filled 2012-05-28: qty 1

## 2012-05-28 MED ORDER — LORAZEPAM 2 MG/ML IJ SOLN
INTRAMUSCULAR | Status: AC
  Administered 2012-05-28: 1 mg
  Filled 2012-05-28: qty 1

## 2012-05-28 MED ORDER — ACETAMINOPHEN 325 MG PO TABS
650.0000 mg | ORAL_TABLET | Freq: Four times a day (QID) | ORAL | Status: DC | PRN
  Administered 2012-05-28: 650 mg via ORAL
  Filled 2012-05-28: qty 2

## 2012-05-28 MED ORDER — LORAZEPAM 2 MG/ML IJ SOLN: 1.0000 mg | INTRAMUSCULAR | Status: DC | PRN

## 2012-05-28 MED ORDER — SODIUM CHLORIDE 0.9 % IJ SOLN
3.0000 mL | Freq: Two times a day (BID) | INTRAMUSCULAR | Status: DC
  Administered 2012-05-28 – 2012-05-29 (×2): 3 mL via INTRAVENOUS

## 2012-05-28 MED ORDER — ONDANSETRON HCL 4 MG PO TABS: 4.0000 mg | ORAL_TABLET | Freq: Four times a day (QID) | ORAL | Status: DC | PRN

## 2012-05-28 MED ORDER — ACETAMINOPHEN 650 MG RE SUPP: 650.0000 mg | Freq: Four times a day (QID) | RECTAL | Status: DC | PRN

## 2012-05-28 MED ORDER — ENOXAPARIN SODIUM 40 MG/0.4ML ~~LOC~~ SOLN
40.0000 mg | SUBCUTANEOUS | Status: DC
  Administered 2012-05-28: 40 mg via SUBCUTANEOUS
  Filled 2012-05-28 (×2): qty 0.4

## 2012-05-28 MED ORDER — DIPHENHYDRAMINE HCL 50 MG/ML IJ SOLN
25.0000 mg | Freq: Once | INTRAMUSCULAR | Status: AC
  Administered 2012-05-28: 25 mg via INTRAVENOUS

## 2012-05-28 MED ORDER — KETOROLAC TROMETHAMINE 30 MG/ML IJ SOLN
INTRAMUSCULAR | Status: AC
  Filled 2012-05-28: qty 1

## 2012-05-28 MED ORDER — OXYCODONE HCL 5 MG PO TABS
5.0000 mg | ORAL_TABLET | ORAL | Status: DC | PRN
  Administered 2012-05-28 – 2012-05-29 (×3): 5 mg via ORAL
  Filled 2012-05-28 (×3): qty 1

## 2012-05-28 MED ORDER — SODIUM CHLORIDE 0.9 % IV SOLN
1000.0000 mg | Freq: Once | INTRAVENOUS | Status: AC
  Administered 2012-05-28: 1000 mg via INTRAVENOUS
  Filled 2012-05-28: qty 20

## 2012-05-28 MED ORDER — SODIUM CHLORIDE 0.9 % IV SOLN
INTRAVENOUS | Status: DC
  Administered 2012-05-28: 1000 mL via INTRAVENOUS

## 2012-05-28 NOTE — ED Notes (Signed)
Patient is resting comfortably now after having grand mal seizure medicated with ativan.  sats dropped to 89% and placed on NRB.  Suctioned and HOB elevated.  No respiratory distress.

## 2012-05-28 NOTE — ED Notes (Signed)
Pt resting and on left side. VSS

## 2012-05-28 NOTE — ED Notes (Signed)
Pt's husband stated patient had a 2 minute seizure.  Pt is resting on side and postictal and Dr. Effie Shy aware and waiting for dilantin
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
## 2012-05-28 NOTE — ED Notes (Signed)
Pt has history of seizures and takes dilantin.  Pt was originally having focal seizures and had grandmal seizure.  Patient has not been responsive for ems.  Pt has had pinpoint pupils since ems.  Pt hand another grandmal seizure for 25 seconds and did vomit.  EMS felt she may have aspirated.  Pt is on NRB and sats 100%.    Pt was given Versed 5mg  iv en route.;  Pt has been on anbx for bladder infection.

## 2012-05-28 NOTE — ED Notes (Signed)
Pt cleaned and sheets changed and pt repositioned

## 2012-05-28 NOTE — Consult Note (Signed)
.   PHARMACY CONSULT-DILANTIN  The patient Veronica Francis has current phenytoin level of  Phenytoin Lvl  Date Value Range Status  05/28/2012 <2.5* 10.0 - 20.0 ug/mL Final  01/10/2012 16.6  10.0 - 20.0 ug/mL Final   No current facility-administered medications on file prior to encounter.   Current Outpatient Prescriptions on File Prior to Encounter  Medication Sig Dispense Refill  . phenytoin (DILANTIN) 100 MG ER capsule Take 500 mg by mouth daily. Takes 3 tablets (300mg ) in the morning and 2 tablets (200mg ) in the afternoon or vice-versa for a total of 500mg  daily        No Current Albumin resulted.  Estimated Creatinine Clearance: 92.3 ml/min (by C-G formula based on Cr of 0.58).   Assessment:  The patient was  loaded with 1000 mg of phenytoin by Intravenous route for an estimated phenytoin  level of ~ 19.55mcg/ml.   Maintenance dose will be 250 mg IV q 12 hours.  Her home Dilantin dose is 500mg  daily in 2 divided doses.  Will check Dilantin level tomorrow prior to second maintenance Dilantin dose and ~ 19 hours after the IV load today.  Will check a Serum Albumin to estimate free Dilantin levels.  Her Albumin was low last year.     Will continue to monitor phenytoin levels, albumin and creatinine as needed for dosing adjustments.  Planned changes, if needed,  for maintenance doses.   Increase by 100 mg per day for level less than 7;  Increase by 50 mg per day for level between 7-12;  Level of 12 and greater: No change.  Patients with levels greater than 18 will be assessed for need for a free phenytoin level and for side effects (i.e. drowsiness, ataxia, lethargy, nausea, vomiting) and doses lowered as appropriate.  Iokepa Geffre, Elisha Headland, Pharm.D.   05/28/2012 7:02 PM

## 2012-05-28 NOTE — ED Notes (Signed)
Per pt family member, pt seized for about 2 minutes, upon arrival to the room the pt was no longer seizing & responds to verbal stimuli, pt vitals WNL, pt on cardiac monitor, Effie Shy, MD informed, will continue to monitor
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
## 2012-05-28 NOTE — ED Notes (Signed)
Pt suctioned.  Coughing and clearing mouth secretions.  Pt with temp 99.7.  Urine obtained via in and out cath
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
## 2012-05-28 NOTE — ED Notes (Signed)
MD obtained labs via femoral stick

## 2012-05-29 ENCOUNTER — Encounter (HOSPITAL_COMMUNITY): Payer: Self-pay | Admitting: *Deleted

## 2012-05-29 DIAGNOSIS — G40209 Localization-related (focal) (partial) symptomatic epilepsy and epileptic syndromes with complex partial seizures, not intractable, without status epilepticus: Secondary | ICD-10-CM

## 2012-05-29 DIAGNOSIS — Z9119 Patient's noncompliance with other medical treatment and regimen: Secondary | ICD-10-CM

## 2012-05-29 DIAGNOSIS — R569 Unspecified convulsions: Secondary | ICD-10-CM

## 2012-05-29 DIAGNOSIS — K5900 Constipation, unspecified: Secondary | ICD-10-CM

## 2012-05-29 LAB — CBC
MCH: 26.7 pg (ref 26.0–34.0)
MCV: 83.3 fL (ref 78.0–100.0)
Platelets: 225 10*3/uL (ref 150–400)
RBC: 3.29 MIL/uL — ABNORMAL LOW (ref 3.87–5.11)
RDW: 17.6 % — ABNORMAL HIGH (ref 11.5–15.5)

## 2012-05-29 LAB — COMPREHENSIVE METABOLIC PANEL
AST: 16 U/L (ref 0–37)
Albumin: 3.4 g/dL — ABNORMAL LOW (ref 3.5–5.2)
Alkaline Phosphatase: 95 U/L (ref 39–117)
Chloride: 105 mEq/L (ref 96–112)
Potassium: 3.2 mEq/L — ABNORMAL LOW (ref 3.5–5.1)
Total Bilirubin: 0.4 mg/dL (ref 0.3–1.2)

## 2012-05-29 LAB — URINE CULTURE

## 2012-05-29 MED ORDER — PHENYTOIN SODIUM EXTENDED 100 MG PO CAPS: 300.0000 mg | ORAL_CAPSULE | Freq: Two times a day (BID) | ORAL | Status: DC

## 2012-05-29 MED ORDER — POTASSIUM CHLORIDE CRYS ER 20 MEQ PO TBCR
40.0000 meq | EXTENDED_RELEASE_TABLET | Freq: Once | ORAL | Status: AC
  Administered 2012-05-29: 40 meq via ORAL
  Filled 2012-05-29: qty 2

## 2012-05-29 MED ORDER — PHENYTOIN SODIUM EXTENDED 100 MG PO CAPS
300.0000 mg | ORAL_CAPSULE | Freq: Two times a day (BID) | ORAL | Status: DC
  Filled 2012-05-29: qty 3

## 2012-05-29 MED ORDER — LORAZEPAM 2 MG/ML IJ SOLN: 1.0000 mg | INTRAMUSCULAR | Status: DC | PRN

## 2012-05-29 MED ORDER — PHENYTOIN SODIUM EXTENDED 100 MG PO CAPS
300.0000 mg | ORAL_CAPSULE | Freq: Two times a day (BID) | ORAL | Status: DC
  Administered 2012-05-29: 300 mg via ORAL
  Filled 2012-05-29 (×2): qty 3
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
## 2012-05-29 NOTE — Clinical Social Work Psychosocial (Signed)
     Clinical Social Work Department BRIEF PSYCHOSOCIAL ASSESSMENT 05/29/2012  Patient:  Veronica Francis, Veronica Francis     Account Number:  [id number]     Admit date:  05/28/2012  Clinical Social Worker:  Jacelyn Grip  Date/Time:  05/29/2012 12:30 PM  Referred by:  RN  Date Referred:  05/29/2012 Referred for  Other - See comment   Other Referral:   other psychosocial needs   Interview type:  Patient Other interview type:   patient spouse    PSYCHOSOCIAL DATA Living Status:  HUSBAND Admitted from facility:   Level of care:   Primary support name:  Veronica Francis/[phone] Primary support relationship to patient:  SPOUSE Degree of support available:   strong, pt spouse at bedside    CURRENT CONCERNS Current Concerns  Other - See comment   Other Concerns:   assistance with letter for court and work letter for pt spouse    SOCIAL WORK ASSESSMENT / PLAN CSW received notification from RN that pt and pt spouse requesting assistance with letter for court and work excuse letter. RN discussed that pt also inquiring about assistance with Keppra medication. Medication referral is inappropriate referral for social work. CSW met with pt and pt spouse at bedside to assess what assistance is needed. Pt discussed that she was scheduled for a court date today and provided contact information for individual that needed court letter and provided permission to this CSW to provide court letter. Pt spouse requesting work excuse letter and inquired about meal voucher. CSW discussed that able to provide letter, but due to limited resources unable to provide meal vouchers at this time. CSW drafted requested letters and faxed letter to appropriate contact at pt request. CSW provided copies of the court letter and copy of the work excuse letter to pt and pt spouse at bedside. No further social work needs identified at this time. RNCM to follow up in regard to medication concerns. CSW signing off.  Please re-consult if further social work needs arise.   Assessment/plan status:  No Further Intervention Required Other assessment/ plan:   Information/referral to community resources:   Diplomatic Services operational officer and Work excuse letter    PATIENTS/FAMILYS RESPONSE TO PLAN OF CARE: Pt alert and oriented. Pt agreeable to this CSW notifying appropriate individual of pt admission to hospital as pt was unable to attend scheduled court date due to admission. Pt and pt spouse appreciative of CSW assistance.

## 2012-05-29 NOTE — Progress Notes (Signed)
Subjective: Breakthru seizures.  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 Patient has been followed by Dr. Tera Helper- Neurologist at [facility]. Dilantin levels were sub therapeutic.  Patient seen at the bedside, this morning, she is awake, alert and follows commands. She denies headaches, no nausea, no vomiting, no dizziness, no confusion, no chest pain or sob.  She denies being on Keppra. She is followed at the residency clinic ( Neurology) at [facility]. She denies of missing Dilantin.  She is on total 500 mg of Dilantin ( 200 mg am and 300 mg  QHS) No gingival hyperplasia, no hypereflexia, no nystagmus to indicate that patient is toxic on Dilantin and or taking regularly.  Objective: Vital signs in last 24 hours: Temp:  [99.7 F (37.6 C)-100 F (37.8 C)] 100 F (37.8 C) (06/25 1648) Pulse Rate:  [68-99] 74  (06/26 0700) Resp:  [18-26] 20  (06/26 0100) BP: (87-126)/(8-89) 105/62 mmHg (06/26 0700) SpO2:  [99 %-100 %] 100 % (06/26 0700) Weight:  [74.5 kg (164 lb 3.9 oz)] 74.5 kg (164 lb 3.9 oz) (06/25 1819) Weight change:     Intake/Output from previous day: 06/25 0701 - 06/26 0700 In: 1467.2 [P.O.:60; I.V.:1407.2] Out: 925 [Urine:925] Intake/Output this shift:     Mental Status: Alert, oriented, thought content appropriate.  Speech fluent without evidence of aphasia.  Able to follow 3 step commands without difficulty. Cranial Nerves: II: visual fields grossly normal, pupils equal, round, reactive to light and accommodation, no nystagmus,  III,IV, VI: ptosis not present, extra-ocular motions intact bilaterally V,VII: smile symmetric, facial light touch sensation normal bilaterally VIII: hearing normal bilaterally IX,X: gag reflex present XI: trapezius strength/neck flexion strength normal bilaterally XII: tongue strength normal  Motor: Right : Upper extremity   5/5    Left:     Upper extremity   5/5  Lower extremity    5/5     Lower extremity   5/5 Tone and bulk:normal tone throughout; no atrophy noted Sensory: Pinprick and light touch intact throughout, bilaterally Deep Tendon Reflexes: 2+ and symmetric throughout  ( no hypereflexia noticed) Plantars: B/l  downgoing Cerebellar:  No abnormalities to indicate that patient is toxic on dilantin     Lab Results:  Basename 05/29/12 0410 05/28/12 1819  WBC 7.9 11.4*  HGB 8.8* 10.8*  HCT 27.4* 33.7*  PLT 225 185   BMET  Basename 05/29/12 0410 05/28/12 1819 05/28/12 1230  NA 138 -- 136  K 3.2* -- 4.0  CL 105 -- 105  CO2 20 -- 18*  GLUCOSE 77 -- 109*  BUN 9 -- 14  CREATININE 0.56 0.60 --  CALCIUM 8.7 -- 9.3    Studies/Results: Ct Head Wo Contrast  05/28/2012  *RADIOLOGY REPORT*  Clinical Data: Seizure.  Confusion.  CT HEAD WITHOUT CONTRAST  Technique:  Contiguous axial images were obtained from the base of the skull through the vertex without contrast.  Comparison: 11/06/2011  Findings: There is no acute intracranial hemorrhage, infarction, or mass lesion.  Brain parenchyma is normal.  Osseous structures are normal.  IMPRESSION: Normal exam.  Original Report Authenticated By: Gwynn Burly, M.D.   Dg Chest Port 1 View  05/28/2012  *RADIOLOGY REPORT*  Clinical Data: Seizures  PORTABLE CHEST - 1 VIEW  Comparison: 01/09/2012  Findings: Vague density overlying the bilateral lower lung fields likely reflects overlying soft tissues.  Lungs are essentially clear. No pleural effusion or pneumothorax.  Heart is top normal in size.  IMPRESSION:  No evidence of acute cardiopulmonary disease.  Original Report Authenticated By: Charline Bills, M.D.    Medications:  Scheduled:   . sodium chloride   Intravenous STAT  . diphenhydrAMINE      . diphenhydrAMINE  25 mg Intravenous Once  . enoxaparin  40 mg Subcutaneous Q24H  . ketorolac  30 mg Intravenous Once  . ketorolac      . levetiracetam  500 mg Intravenous Once  . levETIRAcetam  500 mg Oral BID  .  LORazepam      . metoCLOPramide (REGLAN) injection  10 mg Intravenous Once  . pantoprazole (PROTONIX) IV  40 mg Intravenous Q24H  . phenytoin (DILANTIN) IV  1,000 mg Intravenous Once  . phenytoin (DILANTIN) IV  250 mg Intravenous Q12H  . 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 Patient is stable and can be transferred to the floor.  Recommendations:  1) Switch to PO dilantin  Total of  300mg  AM and 300mg   QHS ( Dilantin follows  Zero kinetics, so it will rise and fall rapidly. )    Please check total level of dilantin along with Albumin Then switch to  200 mg AM and 300 mg QHS  2) C/W Keppra 500 mg PO BID  3) Patient has an established diagnosis of  Seizures, no reason to do  EEG 4) No other precipitating factors to provoke seizures.  5) C/W seizure precautions 6) If a breakthrough seizure is noticed while hospitalized give stat 2 mg of Ativan IV  LOS: 1 day   Kiamesha Samet 05/29/2012, 7:41 AM

## 2012-05-29 NOTE — Progress Notes (Signed)
[facility] [place] TEAM 1 - Stepdown/ICU TEAM  PCP:  Dorrene German, MD  Subjective: 36 year old female with history of seizures on Keppra and Dilantin per ED report who presented with a witnessed seizure by her husband. Patient was at home on the morning of admission and found by her husband to have a focal seizure. Per report he saw her right upper extremity shaking for several minutes and then both extremities were shaking and patient subsequently became unconscious.  EMS was called and during transfer patient had additional seizures for approximately 25 seconds.  A Dilantin level which was less than 2.5.  Patient admits she may have forgotten to take some of her medications.  Pt is resting comfortably.  She is mildly confused at present, but appears to be much improved.  She denies cp, sob, n/v, or abdom pain.  She does c/o being very hungry and having a hunger related headache.  Objective:  Intake/Output Summary (Last 24 hours) at 05/29/12 0952 Last data filed at 05/29/12 0800  Gross per 24 hour  Intake 1592.17 ml  Output    925 ml  Net 667.17 ml   Blood pressure 118/59, pulse 74, temperature 98 F (36.7 C), temperature source Oral, resp. rate 21, height 5' 2.6" (1.59 m), weight 74.5 kg (164 lb 3.9 oz), last menstrual period 05/15/2012, SpO2 99.00%.  CBG (last 3)   Basename 05/28/12 1047  GLUCAP 107*   Physical Exam: General: No acute respiratory distress Lungs: Clear to auscultation bilaterally without wheezes or crackles Cardiovascular: Regular rate and rhythm without murmur gallop or rub normal  Abdomen: Nontender, nondistended, soft, bowel sounds positive, no rebound, no ascites, no appreciable mass Extremities: No significant cyanosis, clubbing, or edema bilateral lower extremities Neuro:  See Neuro note  Lab Results:  Basename 05/29/12 0410 05/28/12 1819 05/28/12 1230  NA 138 -- 136  K 3.2* -- 4.0  CL 105 -- 105  CO2 20 -- 18*  GLUCOSE 77 -- 109*  BUN  9 -- 14  CREATININE 0.56 0.60 0.58  CALCIUM 8.7 -- 9.3  MG -- -- 1.8  PHOS -- -- 2.6    Basename 05/29/12 0410  AST 16  ALT 13  ALKPHOS 95  BILITOT 0.4  PROT 6.0  ALBUMIN 3.4*    Basename 05/29/12 0410 05/28/12 1819 05/28/12 1230  WBC 7.9 11.4* 10.4  NEUTROABS -- -- 9.6*  HGB 8.8* 10.8* 10.1*  HCT 27.4* 33.7* 31.0*  MCV 83.3 85.5 84.5  PLT 225 185 254   Micro Results: Recent Results (from the past 240 hour(s))  MRSA PCR SCREENING     Status: Normal   Collection Time   05/28/12  6:16 PM      Component Value Range Status Comment   MRSA by PCR NEGATIVE  NEGATIVE Final     Studies/Results: All recent x-ray/radiology reports have been reviewed in detail.   Medications: I have reviewed the patient's complete medication list.  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

## 2012-05-29 NOTE — Progress Notes (Signed)
Patient's husband [name] called, password received.  Mr. Veronica Francis informed me that during last hospital admission, they were given discharge orders for keppra, and patient was unable to take because they could not afford it.  The prescribing MD at that time was contacted and a new prescription order with increased dose (noted in medication reconciliation) was given.  Social worker consult placed as per protocol.  For follow-up.  Husband also inquiring why keflex was not reordered during this hospital admission, informed of patient normal WBC and urinalysis results. Continued with present management and care.

## 2012-05-29 NOTE — Progress Notes (Signed)
Patient is signing herself out of the hospital against medical advice. Patient D/C her own IV. Site WDL. Dr. Sharon Seller notified.

## 2012-05-29 NOTE — Consult Note (Signed)
PHARMACY CONSULT-DILANTIN  The patient Veronica Francis has current phenytoin level of  Phenytoin Lvl  Date Value Range Status  05/29/2012 12.3  10.0 - 20.0 ug/mL Final  05/28/2012 <2.5* 10.0 - 20.0 ug/mL Final   No current facility-administered medications on file prior to encounter.   Current Outpatient Prescriptions on File Prior to Encounter  Medication Sig Dispense Refill  . phenytoin (DILANTIN) 100 MG ER capsule Take 500 mg by mouth daily. Takes 3 tablets (300mg ) in the morning and 2 tablets (200mg ) in the afternoon or vice-versa for a total of 500mg  daily        Albumin = 3.7  Estimated Creatinine Clearance: 92.3 ml/min (by C-G formula based on Cr of 0.56).   Assessment/Plan:  The patient was loaded with 1000 mg of phenytoin 6/25 by Intravenous route for an estimated phenytoin  level of ~ 19.59mcg/ml.   Maintenance dose will be 250 mg IV q 12 hours.  Her home Dilantin dose is 500mg  daily in 2 divided doses.  Corrected phenytoin concentration was found to be 14.6 this morning 05/29/2012 which is just below neurology's desired goal of 15-20.  Will follow neuro rec's and change to po 300mg  qam and 300mg  qhs starting po doses tonight-receiving IV dose this am.  Will plan on rechecking dilantin/albumin on Friday - not quite at steady state concentration but will have received several doses by then.  Will continue to monitor phenytoin levels, albumin and creatinine as indicated for dosing adjustments, or if new seizures develop  Continue with keppra for now  Severiano Gilbert, Pharm.D.   05/29/2012 10:51 AM [phone]

## 2012-05-29 NOTE — Progress Notes (Signed)
Patient for transfer to [address], report given to [place], RN, patient stable upon transfer. All belongings with patient upon transfer. Continued with present management and care.

## 2012-05-29 NOTE — Evaluation (Signed)
Occupational Therapy Evaluation Patient Details Name: Veronica Francis MRN: [id number] DOB: Apr 08, 1976 Today's Date: 05/29/2012 Time: 7829-5621 OT Time Calculation (min): 27 min  OT Assessment / Plan / Recommendation Clinical Impression  This 36 y.o. female with history of seizure disorder, admitted with seizure.  Pt. insists she is at her baseline level of functioning, but requires min A for balance, and is very distractable, requiring cues to stay on task.  She does acknowledge memory deficits - unsure of pt. baseline.  Pt. was very tearful during OT eval, and verbalizes difficulty coping with seizures and affect on her life - question if there is a component of depression.  Pt. will benefit from OT to maximize safety and independence with BADLs to allow pt. to return home with family and supervision.  Recommend HHOT    OT Assessment  Patient needs continued OT Services    Follow Up Recommendations  Home health OT;Supervision/Assistance - 24 hour    Barriers to Discharge None    Equipment Recommendations  Other (comment) (need for tub DME to be determined)    Recommendations for Other Services    Frequency  Min 2X/week    Precautions / Restrictions Precautions Precautions: Fall       ADL  Eating/Feeding: Simulated;Independent Where Assessed - Eating/Feeding: Chair Grooming: Simulated;Wash/dry hands;Wash/dry face;Teeth care;Minimal assistance Where Assessed - Grooming: Supported standing Upper Body Bathing: Simulated;Supervision/safety Where Assessed - Upper Body Bathing: Supported sitting Lower Body Bathing: Simulated;Minimal assistance Where Assessed - Lower Body Bathing: Supported sit to stand Upper Body Dressing: Simulated;Supervision/safety Where Assessed - Upper Body Dressing: Unsupported sitting Lower Body Dressing: Simulated;Minimal assistance Where Assessed - Lower Body Dressing: Unsupported sit to stand Toilet Transfer: Simulated;Minimal assistance Toilet  Transfer Method: Sit to Barista: Comfort height toilet Toileting - Clothing Manipulation and Hygiene: Simulated;Minimal assistance Where Assessed - Glass blower/designer Manipulation and Hygiene: Standing Transfers/Ambulation Related to ADLs: Pt. required min A for balance - pt. standing and talking with therapist, turned to look at a line, and had a significant LOB requiring min A to recover ADL Comments: Pt. requires min A for standing balance, and min cues for selective attention    OT Diagnosis: Generalized weakness;Cognitive deficits  OT Problem List: Decreased strength;Impaired balance (sitting and/or standing);Decreased cognition;Decreased safety awareness OT Treatment Interventions: Self-care/ADL training;DME and/or AE instruction;Therapeutic activities;Cognitive remediation/compensation;Patient/family education;Balance training   OT Goals Acute Rehab OT Goals OT Goal Formulation: With patient Time For Goal Achievement: 06/05/12 Potential to Achieve Goals: Good ADL Goals Pt Will Perform Grooming: with supervision;Standing at sink ADL Goal: Grooming - Progress: Goal set today Pt Will Perform Lower Body Bathing: with supervision;Sit to stand from chair ADL Goal: Lower Body Bathing - Progress: Goal set today Pt Will Perform Lower Body Dressing: with supervision;Sit to stand from bed;Sit to stand from chair ADL Goal: Lower Body Dressing - Progress: Goal set today Pt Will Transfer to Toilet: with supervision;Ambulation;Regular height toilet ADL Goal: Toilet Transfer - Progress: Goal set today Pt Will Perform Tub/Shower Transfer: with supervision;with DME;Ambulation ADL Goal: Tub/Shower Transfer - Progress: Goal set today Additional ADL Goal #1: Pt. will sustain selective attention x 15 mins with no cues during simple, familiar task ADL Goal: Additional Goal #1 - Progress: Goal set today Additional ADL Goal #2: Pt. will verbalize 2 strategies for compensation for  memory loss ADL Goal: Additional Goal #2 - Progress: Goal set today  Visit Information  Last OT Received On: 05/29/12 Assistance Needed: +1    Subjective Data  Subjective: "  I don't need anything.  I need them to let me go home." Patient Stated Goal: After long discussion with pt. she is agreeable to OT to address cognition and balance   Prior Functioning  Home Living Lives With: Spouse Available Help at Discharge: Available PRN/intermittently Type of Home: Apartment Home Access: Stairs to enter Entrance Stairs-Number of Steps: 10 Entrance Stairs-Rails: Right Home Layout: One level Bathroom Shower/Tub: Engineer, manufacturing systems: Standard Bathroom Accessibility: Yes How Accessible: Accessible via walker Home Adaptive Equipment: Straight cane;Walker - rolling;Wheelchair - manual Prior Function Level of Independence: Independent with assistive device(s) Able to Take Stairs?: Yes Driving: No Vocation: Unemployed Comments: Pt. reports she does the cooking.  She did acknowledge memory and attentional deficits that began PTA Communication Communication: No difficulties Dominant Hand: Right    Cognition  Overall Cognitive Status: Difficult to assess Area of Impairment: Attention;Safety/judgement;Awareness of deficits;Problem solving Difficult to assess due to: Other (comment) (pt. very resistive to therapies, and very distracted) Arousal/Alertness: Awake/alert Orientation Level: Appears intact for tasks assessed Behavior During Session: Other (comment) (labile) Current Attention Level: Selective Attention - Other Comments: min verbal cues.  Pt. loses train of thought, and demonstrates difficulty following conversation Safety/Judgement: Decreased safety judgement for tasks assessed;Impulsive;Decreased awareness of need for assistance Awareness of Deficits: Pt. demonstrates intellectual awareness Problem Solving: min A for novel tasks Cognition - Other Comments: Pt. very  tearful, voicing frustration about seizures, and doesn't understand why she continues to have them, repetetively states "I don't have epilepsy, and only epileptics need to take medication all the time".  Pt. states she wishes her husband would not call 911 when she has a seizure.  Pt. also states the seizures have taken everything away from her - her job, her "life".  Pt. also states she has been experiencing memory deficits for the last several months, and that her husband has noticed also.  Memory affects her ability to perform household management activities, and keep track of information.      Extremity/Trunk Assessment Right Upper Extremity Assessment RUE ROM/Strength/Tone: Within functional levels RUE Coordination: WFL - gross/fine motor Left Upper Extremity Assessment LUE ROM/Strength/Tone: Within functional levels LUE Coordination: WFL - gross/fine motor   Mobility Transfers Transfers: Sit to Stand;Stand to Sit Sit to Stand: 4: Min assist;From chair/3-in-1 Stand to Sit: 4: Min assist;To chair/3-in-1   Exercise    Balance    End of Session OT - End of Session Activity Tolerance: Other (comment) (pt. tearful and irritable) Patient left: in chair;with call bell/phone within reach;with nursing in room  GO     Alice Burnside M 05/29/2012, 4:48 PM
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
## 2012-05-30 NOTE — Care Management Note (Signed)
    Page 1 of 1   05/30/2012     8:16:23 AM   CARE MANAGEMENT NOTE 05/30/2012  Patient:  [facility]   Account Number:  [id number]  Date Initiated:  05/29/2012  Documentation initiated by:  [facility]  Subjective/Objective Assessment:   Admitted with seizures. Lives with spouse.     Action/Plan:   PT eval   Anticipated DC Date:  06/01/2012   Anticipated DC Plan:  HOME W HOME HEALTH SERVICES      DC Planning Services  CM consult      Choice offered to / List presented to:             Status of service:  Completed, signed off Medicare Important Message given?   (If response is "NO", the following Medicare IM given date fields will be blank) Date Medicare IM given:   Date Additional Medicare IM given:    Discharge Disposition:  AGAINST MEDICAL ADVICE  Per UR Regulation:  Reviewed for med. necessity/level of care/duration of stay  If discussed at Long Length of Stay Meetings, dates discussed:    Comments:

## 2012-05-31 IMAGING — RF DG ANKLE COMPLETE 3+V*L*
1 series · 4 of 4 positions shown · non-contrast
Comparison: None.

CLINICAL DATA: Ankle fracture.

LEFT ANKLE COMPLETE - 3+ VIEW

[Series 1: run · 4 of 4 slices shown]
[im 1/4]
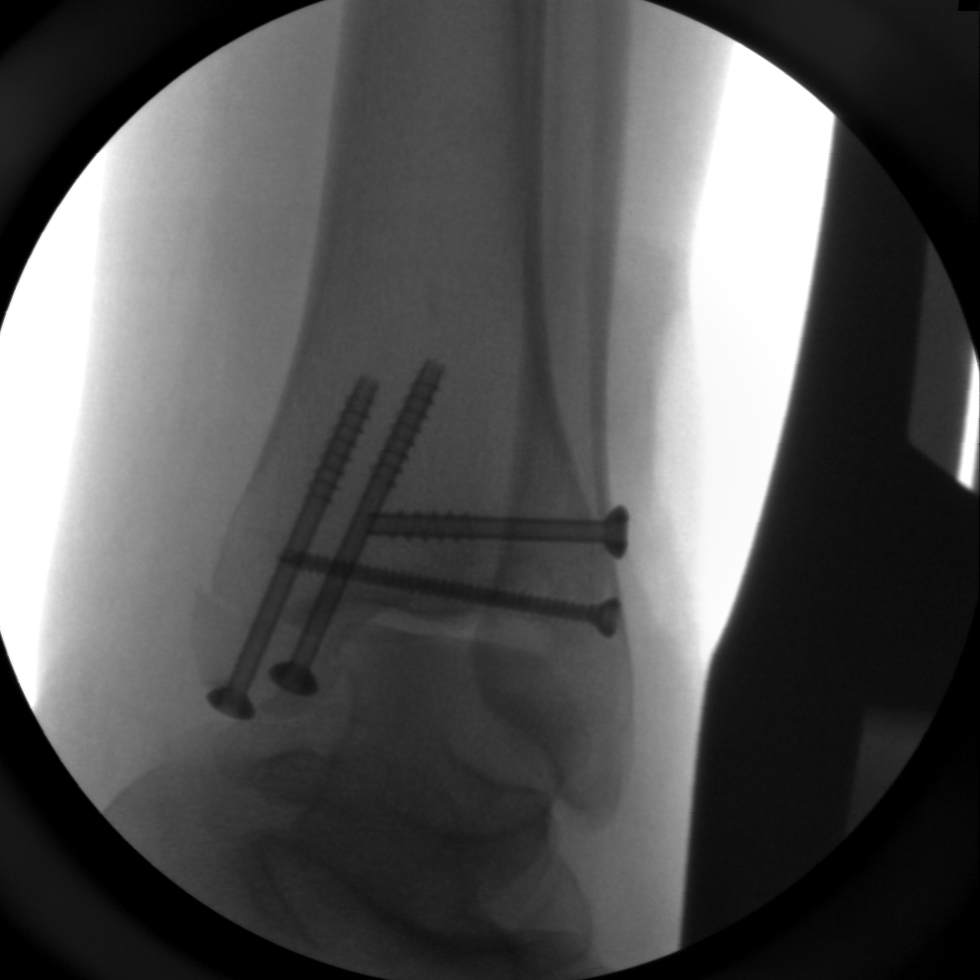
[im 2/4]
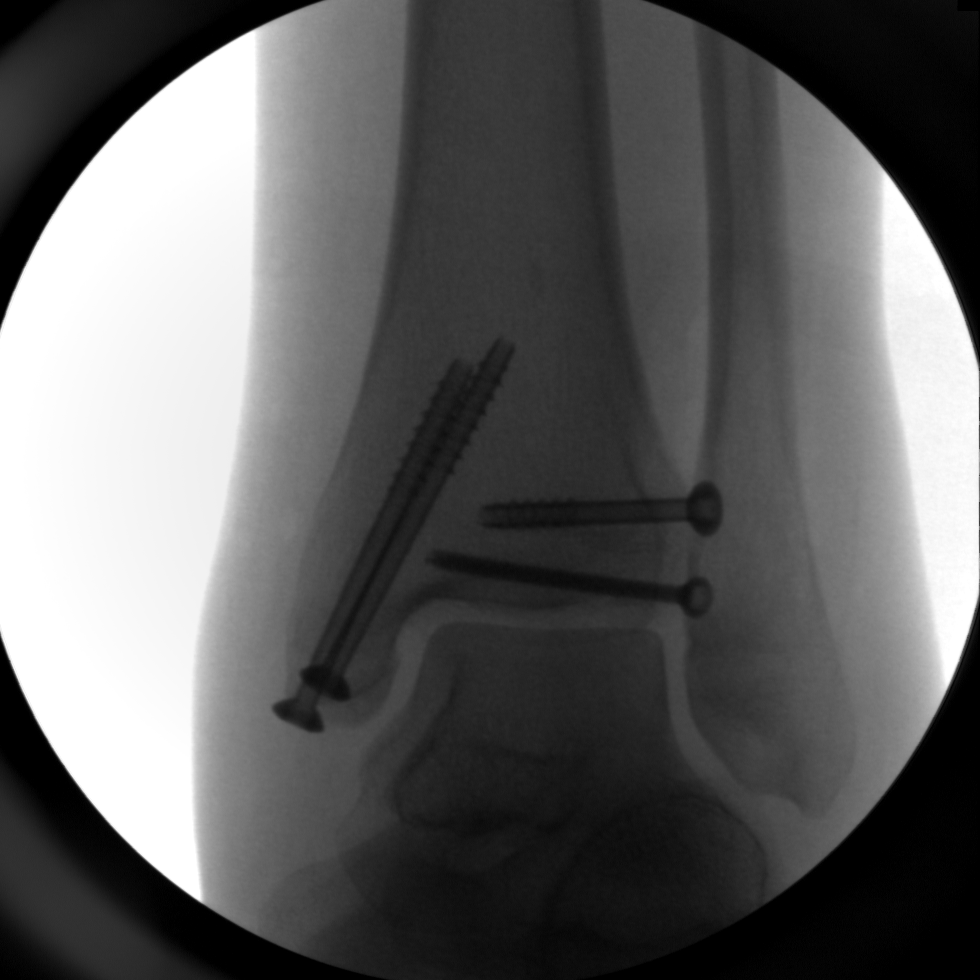
[im 3/4]
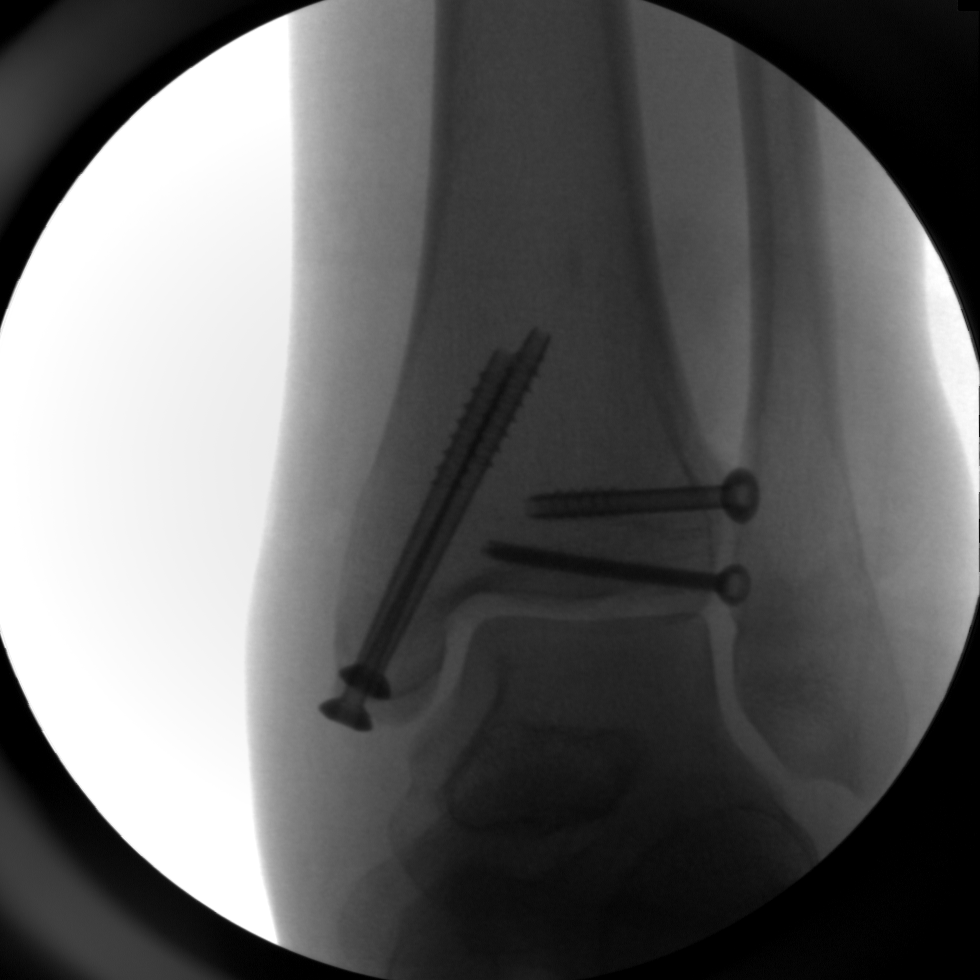
[im 4/4]
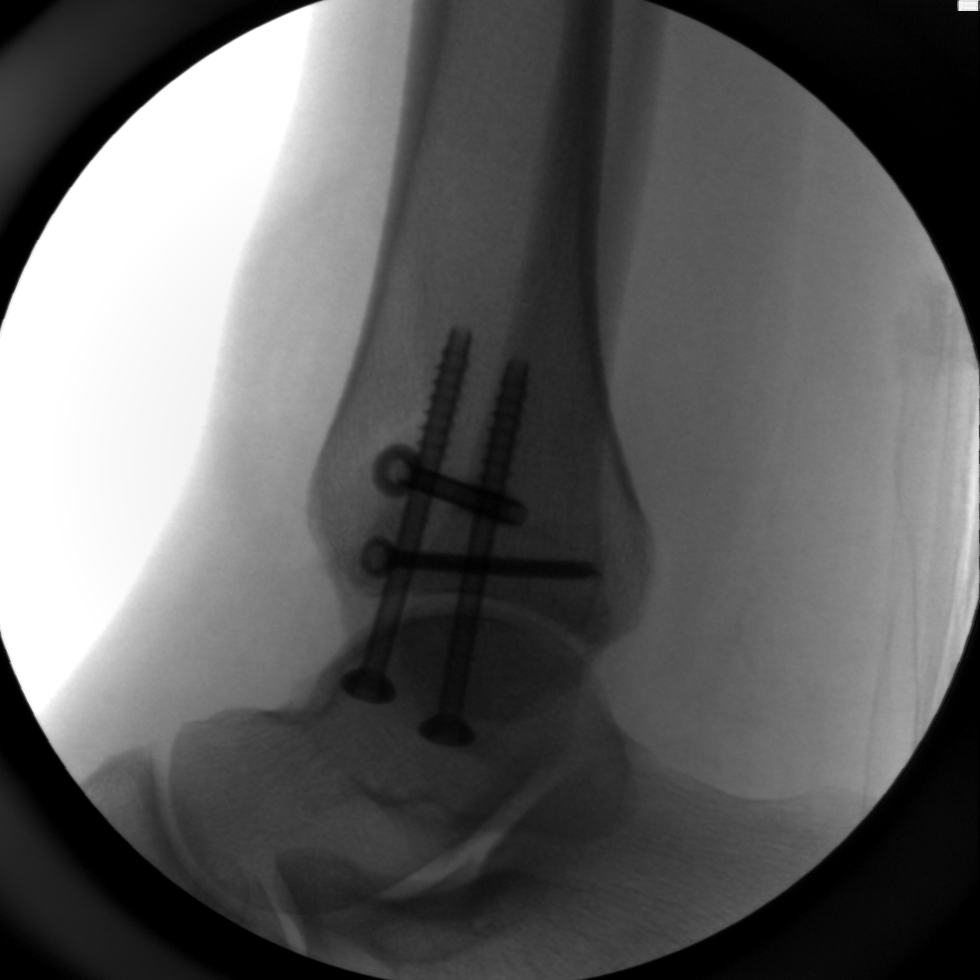

[4 of 4 positions shown; findings below may reference images not displayed]

FINDINGS: Four intraoperative spot images show placement of
fixation screws transfixing fractures through the medial malleolus
and anterolateral portion of the distal tibia, both in the near
anatomic alignment.  The talus is centered within the ankle
mortise.
IMPRESSION: Internal fixation of the distal tibial/ankle fractures in near
anatomic alignment.

## 2012-06-03 ENCOUNTER — Other Ambulatory Visit: Payer: Self-pay

## 2012-06-03 ENCOUNTER — Emergency Department (HOSPITAL_COMMUNITY)
Admission: EM | Admit: 2012-06-03 | Discharge: 2012-06-03 | Disposition: A | Discharge status: Home / Self Care | Payer: Managed Care, Other (non HMO) | Attending: Emergency Medicine | Admitting: Emergency Medicine

## 2012-06-03 DIAGNOSIS — F17200 Nicotine dependence, unspecified, uncomplicated: Secondary | ICD-10-CM | POA: Insufficient documentation

## 2012-06-03 DIAGNOSIS — R51 Headache: Secondary | ICD-10-CM | POA: Insufficient documentation

## 2012-06-03 DIAGNOSIS — Z79899 Other long term (current) drug therapy: Secondary | ICD-10-CM | POA: Insufficient documentation

## 2012-06-03 DIAGNOSIS — G40909 Epilepsy, unspecified, not intractable, without status epilepticus: Secondary | ICD-10-CM | POA: Insufficient documentation

## 2012-06-03 LAB — POCT I-STAT, CHEM 8
BUN: 9 mg/dL (ref 6–23)
Calcium, Ion: 1.16 mmol/L (ref 1.12–1.32)
Chloride: 102 mEq/L (ref 96–112)
Glucose, Bld: 80 mg/dL (ref 70–99)

## 2012-06-03 MED ORDER — SODIUM CHLORIDE 0.9 % IV BOLUS (SEPSIS)
1000.0000 mL | Freq: Once | INTRAVENOUS | Status: AC
  Administered 2012-06-03: 1000 mL via INTRAVENOUS

## 2012-06-03 MED ORDER — KETOROLAC TROMETHAMINE 30 MG/ML IJ SOLN
30.0000 mg | Freq: Once | INTRAMUSCULAR | Status: AC
  Administered 2012-06-03: 30 mg via INTRAVENOUS
  Filled 2012-06-03: qty 1

## 2012-06-03 MED ORDER — NAPROXEN 500 MG PO TABS: 500.0000 mg | ORAL_TABLET | Freq: Two times a day (BID) | ORAL | Status: DC

## 2012-06-03 NOTE — ED Notes (Signed)
Patient has hx of seizure , last seizure at [facility]- witness by husband- lasting 25 min.

## 2012-06-03 NOTE — ED Notes (Signed)
Husband spoke with Hyacinth Meeker, md. Awaiting for discharge paper work

## 2012-06-03 NOTE — ED Notes (Signed)
Patient is up for discharge -  Patient husband is requesting to speak with ED doctor

## 2012-06-03 NOTE — ED Notes (Signed)
ems- patient feels like she is going to have a seizure, c/o headache, took dilantin- 500mg  6/30-  148/98 , 72 hr, 99% ra, 16rr,

## 2012-06-03 NOTE — ED Notes (Signed)
[id number]<AV> Expected date:06/03/12<BR> Expected time: 1:13 AM<BR> Means of arrival:Ambulance<BR> Comments:<BR> PTAR, possible seizure.
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
## 2012-06-03 NOTE — ED Notes (Signed)
MD at bedside. 

## 2012-06-16 NOTE — Discharge Summary (Signed)
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  The patient LEFT THE HOSPITAL AMA and was not discharged.  06/16/2012, 7:49 AM   Lonia Blood, MD [facility] Office  [phone] Pager [phone]  On-Call/Text Page:      [email or web address]      password [facility]

## 2012-10-14 ENCOUNTER — Encounter (HOSPITAL_COMMUNITY): Payer: Self-pay | Admitting: *Deleted

## 2012-10-14 ENCOUNTER — Inpatient Hospital Stay (HOSPITAL_COMMUNITY)
Admission: EM | Admit: 2012-10-14 | Discharge: 2012-10-16 | DRG: 100 | Disposition: A | Discharge status: Home / Self Care | Payer: Managed Care, Other (non HMO) | Attending: Family Medicine | Admitting: Family Medicine

## 2012-10-14 ENCOUNTER — Inpatient Hospital Stay (HOSPITAL_COMMUNITY): Payer: Managed Care, Other (non HMO)

## 2012-10-14 DIAGNOSIS — F319 Bipolar disorder, unspecified: Secondary | ICD-10-CM | POA: Diagnosis present

## 2012-10-14 DIAGNOSIS — E872 Acidosis, unspecified: Secondary | ICD-10-CM | POA: Diagnosis present

## 2012-10-14 DIAGNOSIS — J189 Pneumonia, unspecified organism: Secondary | ICD-10-CM | POA: Diagnosis present

## 2012-10-14 DIAGNOSIS — N390 Urinary tract infection, site not specified: Secondary | ICD-10-CM

## 2012-10-14 DIAGNOSIS — G40909 Epilepsy, unspecified, not intractable, without status epilepticus: Principal | ICD-10-CM | POA: Diagnosis present

## 2012-10-14 DIAGNOSIS — E876 Hypokalemia: Secondary | ICD-10-CM | POA: Diagnosis present

## 2012-10-14 DIAGNOSIS — Z9119 Patient's noncompliance with other medical treatment and regimen: Secondary | ICD-10-CM

## 2012-10-14 DIAGNOSIS — K5900 Constipation, unspecified: Secondary | ICD-10-CM

## 2012-10-14 DIAGNOSIS — F3130 Bipolar disorder, current episode depressed, mild or moderate severity, unspecified: Secondary | ICD-10-CM

## 2012-10-14 DIAGNOSIS — I10 Essential (primary) hypertension: Secondary | ICD-10-CM

## 2012-10-14 DIAGNOSIS — R569 Unspecified convulsions: Secondary | ICD-10-CM

## 2012-10-14 DIAGNOSIS — F1210 Cannabis abuse, uncomplicated: Secondary | ICD-10-CM | POA: Diagnosis present

## 2012-10-14 DIAGNOSIS — Z91199 Patient's noncompliance with other medical treatment and regimen due to unspecified reason: Secondary | ICD-10-CM

## 2012-10-14 DIAGNOSIS — K5909 Other constipation: Secondary | ICD-10-CM

## 2012-10-14 LAB — RAPID URINE DRUG SCREEN, HOSP PERFORMED
Barbiturates: NOT DETECTED
Benzodiazepines: POSITIVE — AB
Cocaine: NOT DETECTED
Opiates: NOT DETECTED
Tetrahydrocannabinol: POSITIVE — AB

## 2012-10-14 LAB — CBC WITH DIFFERENTIAL/PLATELET
Basophils Absolute: 0 10*3/uL (ref 0.0–0.1)
HCT: 32.1 % — ABNORMAL LOW (ref 36.0–46.0)
Lymphocytes Relative: 6 % — ABNORMAL LOW (ref 12–46)
Neutro Abs: 10.8 10*3/uL — ABNORMAL HIGH (ref 1.7–7.7)
Platelets: 244 10*3/uL (ref 150–400)
RDW: 17.1 % — ABNORMAL HIGH (ref 11.5–15.5)
WBC: 12.2 10*3/uL — ABNORMAL HIGH (ref 4.0–10.5)

## 2012-10-14 LAB — BASIC METABOLIC PANEL
BUN: 14 mg/dL (ref 6–23)
CO2: 16 mEq/L — ABNORMAL LOW (ref 19–32)
Chloride: 103 mEq/L (ref 96–112)
Glucose, Bld: 142 mg/dL — ABNORMAL HIGH (ref 70–99)
Potassium: 4.6 mEq/L (ref 3.5–5.1)

## 2012-10-14 LAB — HEPATIC FUNCTION PANEL
ALT: 22 U/L (ref 0–35)
AST: 27 U/L (ref 0–37)
Total Protein: 6.8 g/dL (ref 6.0–8.3)

## 2012-10-14 LAB — URINALYSIS, ROUTINE W REFLEX MICROSCOPIC
Bilirubin Urine: NEGATIVE
Glucose, UA: NEGATIVE mg/dL
Ketones, ur: NEGATIVE mg/dL
Specific Gravity, Urine: 1.019 (ref 1.005–1.030)
pH: 5.5 (ref 5.0–8.0)

## 2012-10-14 LAB — CK TOTAL AND CKMB (NOT AT ARMC): Relative Index: INVALID (ref 0.0–2.5)

## 2012-10-14 LAB — LACTIC ACID, PLASMA: Lactic Acid, Venous: 3.1 mmol/L — ABNORMAL HIGH (ref 0.5–2.2)

## 2012-10-14 MED ORDER — LORAZEPAM 2 MG/ML IJ SOLN
INTRAMUSCULAR | Status: AC
  Administered 2012-10-14: 2 mg
  Filled 2012-10-14: qty 1

## 2012-10-14 MED ORDER — SODIUM CHLORIDE 0.9 % IV BOLUS (SEPSIS)
1000.0000 mL | Freq: Once | INTRAVENOUS | Status: AC
  Administered 2012-10-14: 1000 mL via INTRAVENOUS

## 2012-10-14 MED ORDER — ACETAMINOPHEN 650 MG RE SUPP: 650.0000 mg | Freq: Four times a day (QID) | RECTAL | Status: DC | PRN

## 2012-10-14 MED ORDER — ACETAMINOPHEN 325 MG PO TABS
650.0000 mg | ORAL_TABLET | Freq: Four times a day (QID) | ORAL | Status: DC | PRN
  Administered 2012-10-14 – 2012-10-15 (×2): 650 mg via ORAL
  Filled 2012-10-14: qty 2

## 2012-10-14 MED ORDER — PHENYTOIN SODIUM 50 MG/ML IJ SOLN
100.0000 mg | Freq: Three times a day (TID) | INTRAMUSCULAR | Status: DC
  Administered 2012-10-15 (×2): 100 mg via INTRAVENOUS
  Filled 2012-10-14 (×5): qty 2

## 2012-10-14 MED ORDER — DEXTROSE-NACL 5-0.45 % IV SOLN
INTRAVENOUS | Status: DC
  Administered 2012-10-14: 1000 mL via INTRAVENOUS
  Administered 2012-10-15: 02:00:00 via INTRAVENOUS
  Administered 2012-10-15: 100 mL via INTRAVENOUS

## 2012-10-14 MED ORDER — SODIUM CHLORIDE 0.9 % IV SOLN
750.0000 mg | Freq: Two times a day (BID) | INTRAVENOUS | Status: DC
  Administered 2012-10-14 – 2012-10-15 (×2): 750 mg via INTRAVENOUS
  Filled 2012-10-14 (×2): qty 7.5

## 2012-10-14 MED ORDER — LORAZEPAM 2 MG/ML IJ SOLN
1.0000 mg | Freq: Once | INTRAMUSCULAR | Status: AC
  Administered 2012-10-14: 1 mg via INTRAVENOUS
  Filled 2012-10-14: qty 1

## 2012-10-14 MED ORDER — SODIUM CHLORIDE 0.9 % IV SOLN
1000.0000 mg | Freq: Once | INTRAVENOUS | Status: AC
  Administered 2012-10-14: 1000 mg via INTRAVENOUS
  Filled 2012-10-14: qty 20

## 2012-10-14 MED ORDER — ONDANSETRON HCL 4 MG PO TABS: 4.0000 mg | ORAL_TABLET | Freq: Four times a day (QID) | ORAL | Status: DC | PRN

## 2012-10-14 MED ORDER — HALOPERIDOL LACTATE 5 MG/ML IJ SOLN
5.0000 mg | Freq: Once | INTRAMUSCULAR | Status: AC
  Administered 2012-10-14: 5 mg via INTRAVENOUS
  Filled 2012-10-14: qty 1

## 2012-10-14 MED ORDER — ENOXAPARIN SODIUM 40 MG/0.4ML ~~LOC~~ SOLN
40.0000 mg | SUBCUTANEOUS | Status: DC
  Administered 2012-10-15: 40 mg via SUBCUTANEOUS
  Filled 2012-10-14 (×3): qty 0.4

## 2012-10-14 MED ORDER — SODIUM CHLORIDE 0.9 % IV SOLN
500.0000 mg | Freq: Two times a day (BID) | INTRAVENOUS | Status: DC
  Filled 2012-10-14: qty 5

## 2012-10-14 MED ORDER — ONDANSETRON HCL 4 MG/2ML IJ SOLN: 4.0000 mg | Freq: Four times a day (QID) | INTRAMUSCULAR | Status: DC | PRN

## 2012-10-14 MED ORDER — SODIUM CHLORIDE 0.9 % IV SOLN
500.0000 mg | Freq: Once | INTRAVENOUS | Status: AC
  Administered 2012-10-14: 500 mg via INTRAVENOUS
  Filled 2012-10-14: qty 10
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
## 2012-10-14 NOTE — Progress Notes (Signed)
Request for pharmacy to assist with phenytoin dosing. Patient admitted today after seizing at home even though her dilantin level = 12. Plan to load with IV phenytoin as patient is currently NPO. Load= 1000mg  phenytoin Neurology has contacted Dr. Gwenlyn Perking and recommended increase of Keppra to 750mg  IV bid and advised him to continue Dilantin at 100mg  IV q 8 hours following the load.  Observe patient for symptomatic response. Check Phenytoin level when appropriate.

## 2012-10-14 NOTE — ED Notes (Signed)
Husband Merlyn Albert called and made aware pt is moving to [address]W. Husband states when pt wakes up after seizures she can be violent and to talk slow and not ask a lot of questions at once.

## 2012-10-14 NOTE — ED Notes (Signed)
Per EMS pts husband came home around 8:30 and found pt having a seizure in bed, then 2 hours later called EMS d/t having another seizure, then upon arrival pt had a seizure, total of 3 known today. Was able to walk w/ assistance for EMS then when arrived was focal then full body seizure. Was given 2.5 of versed in each arm by EMS. Pt has a hx of seizures x 4 years ago. Pt takes keppra and dilantin at home, husband unsure if pt took yesterday. Husband works night shift, no one with pt last night. BP 138/90, HR 110, sinus tach, RR 18, CBG 109. NPA placed in R nostral, placed on 15 liters.

## 2012-10-14 NOTE — ED Notes (Signed)
MD hospitalist at bedside.

## 2012-10-14 NOTE — ED Notes (Signed)
pts husband at bedside, states pt has a hx of seizures x 3 years, unsure of why she has seizures, states sometimes when she's stressed or angry will have a seizure, pt states when he came home this morning from work saw pt having a focal seizure x 2 minutes, then after that had 2 grand mal seizures, then another focal seizure, that's when he called EMS, pt had focal/full body seizure for EMS as they stated. Pt was given 5mg  versed by EMS, pt aroused by pain at this time, nasal trumph was taken out, placed on 2L [place], pt snoring at this time. Pt on cardiac monitor, seizure pads placed on side rails.

## 2012-10-14 NOTE — ED Notes (Signed)
Pt had seizure starting at 1318, lasting about 4-5 minutes, Dr. Freida Busman at bedside w/ pt during seizure, ativan given, pt suctioned and placed on non-rebreather mask at 15L.

## 2012-10-14 NOTE — ED Notes (Signed)
Pt is on Keppra and Dilantin at home
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
## 2012-10-14 NOTE — ED Notes (Signed)
Veronica Diego- Husband  [phone]

## 2012-10-15 DIAGNOSIS — N390 Urinary tract infection, site not specified: Secondary | ICD-10-CM

## 2012-10-15 DIAGNOSIS — J189 Pneumonia, unspecified organism: Secondary | ICD-10-CM

## 2012-10-15 LAB — BASIC METABOLIC PANEL
CO2: 25 mEq/L (ref 19–32)
Calcium: 8.4 mg/dL (ref 8.4–10.5)
Creatinine, Ser: 0.56 mg/dL (ref 0.50–1.10)
GFR calc non Af Amer: >90 mL/min (ref >90)
Glucose, Bld: 122 mg/dL — ABNORMAL HIGH (ref 70–99)
Sodium: 136 mEq/L (ref 135–145)

## 2012-10-15 LAB — CBC
Hemoglobin: 9.3 g/dL — ABNORMAL LOW (ref 12.0–15.0)
MCH: 26.4 pg (ref 26.0–34.0)
MCHC: 31.8 g/dL (ref 30.0–36.0)
MCV: 83 fL (ref 78.0–100.0)
RBC: 3.52 MIL/uL — ABNORMAL LOW (ref 3.87–5.11)

## 2012-10-15 MED ORDER — HALOPERIDOL LACTATE 5 MG/ML IJ SOLN
5.0000 mg | Freq: Once | INTRAMUSCULAR | Status: AC
  Administered 2012-10-15: 5 mg via INTRAVENOUS
  Filled 2012-10-15: qty 1

## 2012-10-15 MED ORDER — PHENYTOIN SODIUM EXTENDED 100 MG PO CAPS
300.0000 mg | ORAL_CAPSULE | Freq: Every day | ORAL | Status: DC
  Filled 2012-10-15: qty 3

## 2012-10-15 MED ORDER — POTASSIUM CHLORIDE CRYS ER 20 MEQ PO TBCR
40.0000 meq | EXTENDED_RELEASE_TABLET | ORAL | Status: AC
  Administered 2012-10-15: 40 meq via ORAL
  Filled 2012-10-15: qty 2

## 2012-10-15 MED ORDER — QUETIAPINE FUMARATE ER 200 MG PO TB24
200.0000 mg | ORAL_TABLET | Freq: Every day | ORAL | Status: DC
  Administered 2012-10-15: 200 mg via ORAL
  Filled 2012-10-15 (×2): qty 1

## 2012-10-15 MED ORDER — SODIUM CHLORIDE 0.9 % IV SOLN
INTRAVENOUS | Status: DC
  Administered 2012-10-15: 50 mL/h via INTRAVENOUS

## 2012-10-15 MED ORDER — TRAMADOL HCL 50 MG PO TABS
50.0000 mg | ORAL_TABLET | Freq: Four times a day (QID) | ORAL | Status: DC | PRN
  Administered 2012-10-15: 50 mg via ORAL
  Filled 2012-10-15: qty 1

## 2012-10-15 MED ORDER — DEXTROSE 5 % IV SOLN
1.0000 g | INTRAVENOUS | Status: DC
  Administered 2012-10-15: 1 g via INTRAVENOUS
  Filled 2012-10-15: qty 10

## 2012-10-15 MED ORDER — LEVOFLOXACIN IN D5W 750 MG/150ML IV SOLN
750.0000 mg | INTRAVENOUS | Status: DC
  Administered 2012-10-15: 750 mg via INTRAVENOUS
  Filled 2012-10-15 (×2): qty 150

## 2012-10-15 MED ORDER — DEXTROSE 5 % IV SOLN
500.0000 mg | INTRAVENOUS | Status: DC
  Administered 2012-10-15: 500 mg via INTRAVENOUS
  Filled 2012-10-15: qty 500

## 2012-10-15 MED ORDER — LEVETIRACETAM 750 MG PO TABS
750.0000 mg | ORAL_TABLET | Freq: Two times a day (BID) | ORAL | Status: DC
  Administered 2012-10-15 – 2012-10-16 (×3): 750 mg via ORAL
  Filled 2012-10-15 (×4): qty 1

## 2012-10-15 MED ORDER — PHENYTOIN SODIUM EXTENDED 100 MG PO CAPS
200.0000 mg | ORAL_CAPSULE | Freq: Every morning | ORAL | Status: DC
  Administered 2012-10-16: 200 mg via ORAL
  Filled 2012-10-15: qty 2

## 2012-10-15 NOTE — Progress Notes (Signed)
Subjective: Patient awake and alert.  Requesting to go home.  She feels that she needs to go to work.  Has had no further seizures.  On Keppra and Dilantin  Objective: Current vital signs: BP 126/86  Pulse 78  Temp 98 F (36.7 C) (Oral)  Resp 18  Ht 5' 2.5" (1.588 m)  Wt 79 kg (174 lb 2.6 oz)  BMI 31.35 kg/m2  SpO2 100% Vital signs in last 24 hours: Temp:  [98 F (36.7 C)-103.2 F (39.6 C)] 98 F (36.7 C) (11/12 1410) Pulse Rate:  [41-108] 78  (11/12 1410) Resp:  [12-21] 18  (11/12 1410) BP: (101-126)/(63-86) 126/86 mmHg (11/12 1410) SpO2:  [76 %-100 %] 100 % (11/12 1410) Weight:  [77.9 kg (171 lb 11.8 oz)-79 kg (174 lb 2.6 oz)] 79 kg (174 lb 2.6 oz) (11/12 1100)  Intake/Output from previous day: 11/11 0701 - 11/12 0700 In: 2110.8 [I.V.:1433.3; IV Piggyback:677.5] Out: 2700 [Urine:2700] Intake/Output this shift: Total I/O In: 740 [P.O.:240; I.V.:500] Out: 1800 [Urine:1800] Nutritional status: General  Neurologic Exam: Mental Status: Alert.  Not particularly cooperative due to being upset, oriented, thought content appropriate.  Speech fluent without evidence of aphasia.  Able to follow commands. Cranial Nerves: II: Discs flat bilaterally; Visual fields grossly normal, pupils equal, round, reactive to light and accommodation III,IV, VI: ptosis not present, extra-ocular motions intact bilaterally V,VII: smile symmetric, facial light touch sensation normal bilaterally VIII: hearing normal bilaterally IX,X: gag reflex present XI: bilateral shoulder shrug XII: midline tongue extension Motor: Moves all extremities against gravity Tone and bulk:normal tone throughout; no atrophy noted Sensory: Pinprick and light touch intact throughout, bilaterally Plantars: Right: downgoing   Left: downgoing Gait: stands and transfers with assistance CV: pulses palpable throughout     Lab Results: Basic Metabolic Panel:  Lab 10/15/12 1610 10/14/12 1658 10/14/12 1130  NA 136 --  135  K 3.4* -- 4.6  CL 101 -- 103  CO2 25 -- 16*  GLUCOSE 122* -- 142*  BUN 7 -- 14  CREATININE 0.56 -- 0.55  CALCIUM 8.4 -- 9.0  MG -- 1.5 --  PHOS -- -- --    Liver Function Tests:  Lab 10/14/12 1658  AST 27  ALT 22  ALKPHOS 105  BILITOT 0.2*  PROT 6.8  ALBUMIN 3.9   No results found for this basename: LIPASE:5,AMYLASE:5 in the last 168 hours No results found for this basename: AMMONIA:3 in the last 168 hours  CBC:  Lab 10/15/12 0335 10/14/12 1600  WBC 7.7 12.2*  NEUTROABS -- 10.8*  HGB 9.3* 9.9*  HCT 29.2* 32.1*  MCV 83.0 84.9  PLT 222 244    Cardiac Enzymes:  Lab 10/14/12 1657  CKTOTAL 91  CKMB 1.4  CKMBINDEX --  TROPONINI --    Lipid Panel: No results found for this basename: CHOL:5,TRIG:5,HDL:5,CHOLHDL:5,VLDL:5,LDLCALC:5 in the last 168 hours  CBG:  Lab 10/14/12 1115  GLUCAP 143*    Microbiology: Results for orders placed during the hospital encounter of 10/14/12  MRSA PCR SCREENING     Status: Normal   Collection Time   10/14/12  4:33 PM      Component Value Range Status Comment   MRSA by PCR NEGATIVE  NEGATIVE Final     Coagulation Studies: No results found for this basename: LABPROT:5,INR:5 in the last 72 hours  Imaging: Dg Chest Port 1 View  10/14/2012  *RADIOLOGY REPORT*  Clinical Data: Fevers  PORTABLE CHEST - 1 VIEW  Comparison: 05/28/2012  Findings: There is rotational  artifact.  Heart size is normal.  No pleural effusion or edema.  Airspace consolidation within the right upper lobe is identified, suspicious for pneumonia.  There is associated atelectasis.  Left lung is clear.  No pleural effusion or edema.  IMPRESSION:  1.  Upper lobe airspace disease.  Suspicious for pneumonia.   Original Report Authenticated By: Signa Kell, M.D.     Medications:  I have reviewed the patient's current medications. Scheduled:   . enoxaparin (LOVENOX) injection  40 mg Subcutaneous Q24H  . [COMPLETED] haloperidol lactate  5 mg Intravenous  Once  . [COMPLETED] haloperidol lactate  5 mg Intravenous Once  . levETIRAcetam  750 mg Oral BID  . levofloxacin (LEVAQUIN) IV  750 mg Intravenous Q24H  . [COMPLETED] LORazepam  1 mg Intravenous Once  . [COMPLETED] phenytoin (DILANTIN) IV  1,000 mg Intravenous Once  . phenytoin  200 mg Oral q morning - 10a  . phenytoin  300 mg Oral QHS  . [DISCONTINUED] azithromycin  500 mg Intravenous Q24H  . [DISCONTINUED] cefTRIAXone (ROCEPHIN)  IV  1 g Intravenous Q24H  . [DISCONTINUED] levetiracetam  500 mg Intravenous BID  . [DISCONTINUED] levetiracetam  750 mg Intravenous BID  . [DISCONTINUED] phenytoin (DILANTIN) IV  100 mg Intravenous Q8H    Assessment/Plan:  Patient Active Hospital Problem List: Seizures ()   Assessment: Patient without further seizures.  Keppra increased.  Dilantin supratherapeutic at 24.3.  Anticonvulsants now all po.     Plan:  1.  Continue AED's at current doses  2.  Dilantin level in AM    LOS: 1 day   Thana Farr, MD [facility] [phone] 10/15/2012  4:35 PM

## 2012-10-15 NOTE — Progress Notes (Signed)
MEDICATION RELATED CONSULT NOTE - Follow-up  Pharmacy Consult for phenytoin Indication: Seizures  Allergies  Allergen Reactions  . Morphine And Related     Patient Measurements: Weight: 171 lb 11.8 oz (77.9 kg) Adjusted Body Weight:   Vital Signs: Temp: 99 F (37.2 C) (11/12 0735) BP: 109/65 mmHg (11/12 0735) Pulse Rate: 85  (11/12 0735) Intake/Output from previous day: 11/11 0701 - 11/12 0700 In: 2010.8 [I.V.:1333.3; IV Piggyback:677.5] Out: 2700 [Urine:2700] Intake/Output from this shift: Total I/O In: -  Out: 1000 [Urine:1000]  Labs:  Basename 10/15/12 0335 10/14/12 1658 10/14/12 1600 10/14/12 1130  WBC 7.7 -- 12.2* --  HGB 9.3* -- 9.9* --  HCT 29.2* -- 32.1* --  PLT 222 -- 244 --  APTT -- -- -- --  CREATININE 0.56 -- -- 0.55  LABCREA -- -- -- --  CREATININE 0.56 -- -- 0.55  CREAT24HRUR -- -- -- --  MG -- 1.5 -- --  PHOS -- -- -- --  ALBUMIN -- 3.9 -- --  PROT -- 6.8 -- --  ALBUMIN -- 3.9 -- --  AST -- 27 -- --  ALT -- 22 -- --  ALKPHOS -- 105 -- --  BILITOT -- 0.2* -- --  BILIDIR -- 0.1 -- --  IBILI -- 0.1* -- --   The CrCl is unknown because both a height and weight (above a minimum accepted value) are required for this calculation.   Microbiology: Recent Results (from the past 720 hour(s))  MRSA PCR SCREENING     Status: Normal   Collection Time   10/14/12  4:33 PM      Component Value Range Status Comment   MRSA by PCR NEGATIVE  NEGATIVE Final     Medical History: Past Medical History  Diagnosis Date  . Seizures   . Constipation, chronic     Medications:  Scheduled:    . azithromycin  500 mg Intravenous Q24H  . enoxaparin (LOVENOX) injection  40 mg Subcutaneous Q24H  . [COMPLETED] haloperidol lactate  5 mg Intravenous Once  . [COMPLETED] haloperidol lactate  5 mg Intravenous Once  . levetiracetam  750 mg Intravenous BID  . [COMPLETED] LORazepam      . [COMPLETED] LORazepam  1 mg Intravenous Once  . [COMPLETED] phenytoin  (DILANTIN) IV  1,000 mg Intravenous Once  . [COMPLETED] phenytoin (DILANTIN) IV  500 mg Intravenous Once  . phenytoin (DILANTIN) IV  100 mg Intravenous Q8H  . [COMPLETED] sodium chloride  1,000 mL Intravenous Once  . [DISCONTINUED] cefTRIAXone (ROCEPHIN)  IV  1 g Intravenous Q24H  . [DISCONTINUED] levetiracetam  500 mg Intravenous BID    Assessment: 36 YOF admitted with seizures, on phenytoin/keppra prior to admission.  Phenytoin level at admit = 12 (corrected = 13.6) on 300mg  in AM and 200mg  in PM. In ED ordered phenytoin 500mg  LD x 1 then pharmacy ordered additional 1gm phenytoin IV LD x 1. Then started on phenytoin 100mg  IV q8h  Phenytoin level 11/12 at 03:35 = 24.3, corrects to 27.6 (last phenytoin dose was 100mg  given at 02:00)  Goal of Therapy:  Phenytoin level 10 -20 mcg/ml  Plan:  - Phenytoin level elevated this am following 1.5gm loading dose, suspect the additional 1gm of phenytoin was not needed based on level at admit.  Level also elevated d/t being drawn ~1h after 100mg  dose given.  Level this am actually lower than I suspected per calculations.   - Currently IV phenytoin dose lower dose than home PO dose.  Dr.  Gwenlyn Perking has resumed home phenytoin dose which appears appropriate based on admit phenytoin level - Check Phenytoin level in am and albumin to further investigate elevated level from this am.   Dannielle Huh 10/15/2012,10:18 AM

## 2012-10-15 NOTE — Progress Notes (Signed)
[facility] PROGRESS NOTE  Veronica Francis ZOX:[id number] DOB: 1976/11/05 DOA: 10/14/2012 PCP: Dorrene German, MD  Assessment/Plan 1-Seizure disorder: Most likely associated with medication non-compliance and decrease seizure threshold from infection.  -Will follow dose adjustments as recommended from neurology (Keppra increased to 750 BID and continue dilantin).  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  -Will transfer out of step down and continue treatment for infections with levaquin.  -Continue seizure precautions -Will advance diet.   2-Metabolic acidosis: likely related to #1. Lactic acid 3.1. Unclear when seizures started as husband works night shift and as his arrival she was already seizing.  -Will advance diet and change fluids to maintenance -Bicarb WNL now after aggressive fluid resuscitation.   3-UTI and PNA: Patient could have CAP vs aspiration (especially with seizure). Will treat with levaquin. Cx's pending.  4-HTN (hypertension): no documented hx of HTN in the past, and currently not taking any antihypertensive agent prior to admission. Likely related to #1. Resolved in ED. Will monitor closely   5-Hx bipolar: on seroquel at home. Will resume  6-Hypokalemia: will replete and check BMET in am  DVT: lovenox  Code Status: Full Family Communication: husband at bedside Disposition Plan: home when medically stable   Consultants:  Neurology  Procedures:  CXR (RUL infiltrate)  Antibiotics:  levaquin  HPI/Subjective: Spike fever up to 103; CXR demonstrating infiltrate in RUL with concerns for PNA; also with UA suggesting UTI. Patient really agitated overnight in post-ictal state and later frustration with the fact she required restrains, was NPO and her IV infiltrated. No doing much better and is cooperative. Wants to eat' is afebrile and even somnolent, she is AAOX3.   Objective: Filed Vitals:   10/15/12 0500 10/15/12 0545 10/15/12 0700 10/15/12 0735  BP:  113/76 101/67 109/65  Pulse:  41 87 85  Temp: 99.9 F (37.7 C) 99.3 F (37.4 C) 99.7 F (37.6 C) 99 F (37.2 C)  TempSrc:      Resp: 16 16 15 15   Weight:      SpO2:  76% 100% 100%    Intake/Output Summary (Last 24 hours) at 10/15/12 1120 Last data filed at 10/15/12 0800  Gross per 24 hour  Intake 2010.83 ml  Output   3700 ml  Net -1689.17 ml   Filed Weights   10/14/12 1624 10/15/12 0000  Weight: 76.5 kg (168 lb 10.4 oz) 77.9 kg (171 lb 11.8 oz)    Exam:   General:  AAOX3, mild somnolence; afebrile  Cardiovascular: RRR, no rubs, no gallops  Respiratory: CTA  Abdomen: soft, NT, ND, positive BS  Extremities: no edema, no cyanosis  Neuro: mild somnolence from meds given overnight; but cooperative, no agitation, CN intact and no focal deficit  Data Reviewed: Basic Metabolic Panel:  Lab 10/15/12 8119 10/14/12 1658 10/14/12 1130  NA 136 -- 135  K 3.4* -- 4.6  CL 101 -- 103  CO2 25 -- 16*  GLUCOSE 122* -- 142*  BUN 7 -- 14  CREATININE 0.56 -- 0.55  CALCIUM 8.4 -- 9.0  MG -- 1.5 --  PHOS -- -- --   Liver Function Tests:  Lab 10/14/12 1658  AST 27  ALT 22  ALKPHOS 105  BILITOT 0.2*  PROT 6.8  ALBUMIN 3.9   CBC:  Lab 10/15/12 0335 10/14/12 1600  WBC 7.7 12.2*  NEUTROABS -- 10.8*  HGB 9.3* 9.9*  HCT 29.2* 32.1*  MCV 83.0 84.9  PLT 222 244  Cardiac Enzymes:  Lab 10/14/12 1657  CKTOTAL 91  CKMB 1.4  CKMBINDEX --  TROPONINI --   CBG:  Lab 10/14/12 1115  GLUCAP 143*    Recent Results (from the past 240 hour(s))  MRSA PCR SCREENING     Status: Normal   Collection Time   10/14/12  4:33 PM      Component Value Range Status Comment   MRSA by PCR NEGATIVE  NEGATIVE Final      Studies: Dg Chest Port 1 View  10/14/2012  *RADIOLOGY REPORT*  Clinical Data: Fevers  PORTABLE CHEST - 1 VIEW  Comparison: 05/28/2012  Findings: There is rotational artifact.  Heart size is normal.  No  pleural effusion or edema.  Airspace consolidation within the right upper lobe is identified, suspicious for pneumonia.  There is associated atelectasis.  Left lung is clear.  No pleural effusion or edema.  IMPRESSION:  1.  Upper lobe airspace disease.  Suspicious for pneumonia.   Original Report Authenticated By: Signa Kell, M.D.     Scheduled Meds:   . enoxaparin (LOVENOX) injection  40 mg Subcutaneous Q24H  . [COMPLETED] haloperidol lactate  5 mg Intravenous Once  . [COMPLETED] haloperidol lactate  5 mg Intravenous Once  . levETIRAcetam  750 mg Oral BID  . levofloxacin (LEVAQUIN) IV  750 mg Intravenous Q24H  . [COMPLETED] LORazepam      . [COMPLETED] LORazepam  1 mg Intravenous Once  . [COMPLETED] phenytoin (DILANTIN) IV  1,000 mg Intravenous Once  . [COMPLETED] phenytoin (DILANTIN) IV  500 mg Intravenous Once  . phenytoin  200 mg Oral q morning - 10a  . phenytoin  300 mg Oral QHS  . [COMPLETED] sodium chloride  1,000 mL Intravenous Once  . [DISCONTINUED] azithromycin  500 mg Intravenous Q24H  . [DISCONTINUED] cefTRIAXone (ROCEPHIN)  IV  1 g Intravenous Q24H  . [DISCONTINUED] levetiracetam  500 mg Intravenous BID  . [DISCONTINUED] levetiracetam  750 mg Intravenous BID  . [DISCONTINUED] phenytoin (DILANTIN) IV  100 mg Intravenous Q8H   Continuous Infusions:   . sodium chloride    . [DISCONTINUED] dextrose 5 % and 0.45% NaCl 100 mL (10/15/12 0933)    Time spent: >30 minutes    Reynolds Kittel  [facility] Pager [phone]. If 8PM-8AM, please contact night-coverage at [email or web address], password [facility] 10/15/2012, 11:20 AM  LOS: 1 day

## 2012-10-15 NOTE — Progress Notes (Signed)
CARE MANAGEMENT NOTE 10/15/2012  Patient:  Veronica Francis, Veronica Francis   Account Number:  [id number]  Date Initiated:  10/15/2012  Documentation initiated by:  DAVIS,RHONDA  Subjective/Objective Assessment:   pt admitted due to seizures and ams abusiver and  stricking out at staff requiring iv dilatin and restriants.     Action/Plan:   from home with husband question of substance abuse and not taking medications.   Anticipated DC Date:  10/18/2012   Anticipated DC Plan:  HOME/SELF CARE  In-house referral  Clinical Social Worker      DC Planning Services  NA      [facility] Choice  NA   Choice offered to / List presented to:  NA   DME arranged  NA      DME agency  NA     HH arranged  NA      HH agency  NA   Status of service:  In process, will continue to follow Medicare Important Message given?  NA - LOS <3 / Initial given by admissions (If response is "NO", the following Medicare IM given date fields will be blank) Date Medicare IM given:   Date Additional Medicare IM given:    Discharge Disposition:    Per UR Regulation:  Reviewed for med. necessity/level of care/duration of stay  If discussed at Long Length of Stay Meetings, dates discussed:    Comments:  [id number]/Rhonda Earlene Plater, RN, BSN, CCM: CHART REVIEWED AND UPDATED.  Next Review due on [id number]. NO DISCHARGE NEEDS PRESENT AT THIS TIME. CASE MANAGEMENT [phone]

## 2012-10-16 LAB — BASIC METABOLIC PANEL
BUN: 8 mg/dL (ref 6–23)
Calcium: 8.8 mg/dL (ref 8.4–10.5)
Chloride: 106 mEq/L (ref 96–112)
Creatinine, Ser: 0.55 mg/dL (ref 0.50–1.10)
GFR calc Af Amer: >90 mL/min (ref >90)

## 2012-10-16 LAB — PHENYTOIN LEVEL, TOTAL: Phenytoin Lvl: 24.2 ug/mL — ABNORMAL HIGH (ref 10.0–20.0)

## 2012-10-16 LAB — HEPATIC FUNCTION PANEL
ALT: 18 U/L (ref 0–35)
AST: 17 U/L (ref 0–37)
Bilirubin, Direct: <0.1 mg/dL (ref 0.0–0.3)
Total Bilirubin: 0.2 mg/dL — ABNORMAL LOW (ref 0.3–1.2)

## 2012-10-16 MED ORDER — PHENYTOIN SODIUM EXTENDED 100 MG PO CAPS: 200.0000 mg | ORAL_CAPSULE | Freq: Two times a day (BID) | ORAL | Status: DC

## 2012-10-16 MED ORDER — LEVETIRACETAM 750 MG PO TABS: 750.0000 mg | ORAL_TABLET | Freq: Two times a day (BID) | ORAL | Status: DC

## 2012-10-16 MED ORDER — PHENYTOIN 50 MG PO CHEW: 50.0000 mg | CHEWABLE_TABLET | Freq: Every day | ORAL | Status: DC

## 2012-10-16 MED ORDER — LEVETIRACETAM 750 MG PO TABS
750.0000 mg | ORAL_TABLET | Freq: Two times a day (BID) | ORAL | Status: DC
  Filled 2012-10-16: qty 1

## 2012-10-16 MED ORDER — AZITHROMYCIN 500 MG PO TABS
500.0000 mg | ORAL_TABLET | Freq: Every day | ORAL | Status: DC
  Filled 2012-10-16: qty 1

## 2012-10-16 MED ORDER — AZITHROMYCIN 250 MG PO TABS: 250.0000 mg | ORAL_TABLET | Freq: Every day | ORAL | Status: DC

## 2012-10-16 NOTE — Discharge Summary (Signed)
Physician Discharge Summary  Veronica Francis WGN:[id number] DOB: [birth year] DOA: 10/14/2012  PCP: Dorrene German, MD Neurologist: Angela Nevin, M.D. [facility] Fax [phone] Patient considering establishing at [facility] with Onnie Graham, MD FAX:  [phone]  Admit date: 10/14/2012 Discharge date: 10/16/2012  Recommendations for Outpatient Follow-up:  1. Repeat Dilantin level to assess response to change in dose. 2. Follow-up breakthrough seizures 3. Continue to encourage THC cessation. 4. Follow-up resolution of possible pneumonia 5. Final urine culture.   Follow-up Information    Follow up with AVBUERE,EDWIN A, MD. In 1 week.   Contact information:   [address] [place] [address] [phone]       Schedule an appointment as soon as possible for a visit with BOGGS,JANE, MD. (as soon as possible for blood work and office follow-up (Dilantin level))         Discharge Diagnoses:  1. Breakthrough seizures/Seizure disorder 2. Metabolic acidosis 3. Possible Pneumonia 4. Reported history of Bipolar disorder 5. Reported history of non-compliance 6. THC use  Discharge Condition: improved Disposition: home  Diet recommendation: regular  Filed Weights   10/14/12 1624 10/15/12 0000 10/15/12 1100  Weight: 76.5 kg (168 lb 10.4 oz) 77.9 kg (171 lb 11.8 oz) 79 kg (174 lb 2.6 oz)    History of present illness:  36 year old woman presented with history of seizures at home. Witnessed seizure in ED. Work up yielded sub-therapeutic dilantin level 12, metabolic acidosis and HTN.  Hospital Course:  Ms. Heiden was admitted and seen by neurology. She had no further seizures after admission. Because of low Dilantin level she was loaded with IV Dilantin and because of breakthrough seizures Keppra was increased by neurology. On discharge Dilantin level was noted to be high (thought secondary to load and increased dosing as inpatient). I discussed with Dr.  Roseanne Reno 11/13 as below. Patient is thinking of establishing with a new neurologist, I have advised her to follow-up with her established neurologist unless she can obtain very close follow-up with the new clinic as above. She understands. Imaging suggested pneumonia, though patient was asymptomatic, it could have precipitated seizures.  1. Breakthrough seizures/Seizure disorder--etiology unclear, record reports that husband suspected non-compliance, differential would include THC use, occult drug use, infection lowering seizure threshold (CAP). No further seizures. Dilantin decreased to 200 mg in the morning and 250 mg at bedtime and Keppra increased to 750 mg BID per neurology. Discussed with Dr. Roseanne Reno. Dilantin level high today, he recommends hold Dilantin today and resume tomorrow morning. He concurs with discharge today.  2. Metabolic acidosis--resolved. Total ck normal, elevated lactic acid likely related to seizure. 3. Possible UTI--doubted, urinalysis equivocal, patient asymptomatic prior to admission. Culture pending. Will not Rx now, but will follow-up culture and contact patient if positive.  4. Possible Pneumonia--location inconsistent with aspiration. Started on Levaquin as inpatient and now asymptomatic. Cannot exclude CAP. Finish treatment with Zithromax, will avoid Levaquin as can lower seizure threshold.  5. Reported history of Bipolar disorder--stable, continue Seroquel.  6. Reported history of non-compliance--possibly ongoing per report in chart.  7. THC use--recommend cessation  Consultants:  Neurology  Antibiotics:  Levaquin 11/12 >> 11/12   zithromax 11/13 >> 11/15  Discharge Instructions  Discharge Orders    Future Orders Please Complete By Expires   Diet - low sodium heart healthy      Increase activity slowly      Discharge instructions      Comments:   Continue seizure precautions including no driving,  operating heavy machinery, climbing ladders, open water  swimming. Speak with your neurologist for further precautions. Your dose of Keppra has been increased by the neurologist to 750 mg twice a day and your Dilantin dose has been changed to 200 mg in the morning and a total of 250 mg in the evening. The evening dose consists of two different formulations of Dilantin (a 50 mg tablet and two 100 mg tablets). Your chest x-ray revealed pneumonia and you should finish Zithromax (an antibiotic). Call physician or seek immediate medical attention for seizures or change in condition.       Medication List     As of 10/16/2012 12:45 PM    TAKE these medications         azithromycin 250 MG tablet   Commonly known as: ZITHROMAX   Take 1 tablet (250 mg total) by mouth daily.      levETIRAcetam 750 MG tablet   Commonly known as: KEPPRA   Take 1 tablet (750 mg total) by mouth 2 (two) times daily.      phenytoin 100 MG ER capsule   Commonly known as: DILANTIN   Take 2 capsules (200 mg total) by mouth 2 (two) times daily. Skip 11/13 PM dose. Start 11/14 AM.      phenytoin 50 MG tablet   Commonly known as: DILANTIN   Chew 1 tablet (50 mg total) by mouth at bedtime. Take at bedtime along with long-acting Dilantin for total of 250 mg Dilantin each night. Skip 11/13 PM dose. Start 11/14.      QUEtiapine 200 MG 24 hr tablet   Commonly known as: SEROQUEL XR   Take 200 mg by mouth at bedtime.        The results of significant diagnostics from this hospitalization (including imaging, microbiology, ancillary and laboratory) are listed below for reference.    Significant Diagnostic Studies: Dg Chest Port 1 View  10/14/2012  *RADIOLOGY REPORT*  Clinical Data: Fevers  PORTABLE CHEST - 1 VIEW  Comparison: 05/28/2012  Findings: There is rotational artifact.  Heart size is normal.  No pleural effusion or edema.  Airspace consolidation within the right upper lobe is identified, suspicious for pneumonia.  There is associated atelectasis.  Left lung is clear.  No  pleural effusion or edema.  IMPRESSION:  1.  Upper lobe airspace disease.  Suspicious for pneumonia.   Original Report Authenticated By: Signa Kell, M.D.     Microbiology: Recent Results (from the past 240 hour(s))  MRSA PCR SCREENING     Status: Normal   Collection Time   10/14/12  4:33 PM      Component Value Range Status Comment   MRSA by PCR NEGATIVE  NEGATIVE Final   URINE CULTURE     Status: Normal (Preliminary result)   Collection Time   10/14/12  6:00 PM      Component Value Range Status Comment   Specimen Description URINE, CATHETERIZED   Final    Special Requests Normal   Final    Culture  Setup Time 10/15/2012 03:10   Final    Colony Count PENDING   Incomplete    Culture Culture reincubated for better growth   Final    Report Status PENDING   Incomplete   CULTURE, BLOOD (ROUTINE X 2)     Status: Normal (Preliminary result)   Collection Time   10/15/12 12:15 AM      Component Value Range Status Comment   Specimen Description BLOOD LEFT ARM  Final    Special Requests     Final    Value: BOTTLES DRAWN AEROBIC AND ANAEROBIC 3CC AER 2CC ANA   Culture  Setup Time 10/15/2012 08:49   Final    Culture     Final    Value:        BLOOD CULTURE RECEIVED NO GROWTH TO DATE CULTURE WILL BE HELD FOR 5 DAYS BEFORE ISSUING A FINAL NEGATIVE REPORT   Report Status PENDING   Incomplete      Labs: Basic Metabolic Panel:  Lab 10/16/12 1610 10/15/12 0335 10/14/12 1658 10/14/12 1130  NA 140 136 -- 135  K 3.5 3.4* -- 4.6  CL 106 101 -- 103  CO2 26 25 -- 16*  GLUCOSE 83 122* -- 142*  BUN 8 7 -- 14  CREATININE 0.55 0.56 -- 0.55  CALCIUM 8.8 8.4 -- 9.0  MG -- -- 1.5 --  PHOS -- -- -- --   Liver Function Tests:  Lab 10/16/12 0520 10/14/12 1658  AST 17 27  ALT 18 22  ALKPHOS 85 105  BILITOT 0.2* 0.2*  PROT 6.3 6.8  ALBUMIN 3.3* 3.9   CBC:  Lab 10/15/12 0335 10/14/12 1600  WBC 7.7 12.2*  NEUTROABS -- 10.8*  HGB 9.3* 9.9*  HCT 29.2* 32.1*  MCV 83.0 84.9  PLT 222 244    Cardiac Enzymes:  Lab 10/14/12 1657  CKTOTAL 91  CKMB 1.4  CKMBINDEX --  TROPONINI --   CBG:  Lab 10/14/12 1115  GLUCAP 143*    Principal Problem:  *Seizures Active Problems:  Constipation, chronic  Metabolic acidosis  HTN (hypertension)  Bipolar 1 disorder, depressed  PNA (pneumonia)   Time coordinating discharge: 45 minutes Signed:  Brendia Sacks, MD [facility] 10/16/2012, 12:45 PM

## 2012-10-16 NOTE — Progress Notes (Signed)
[facility] PROGRESS NOTE  Veronica Francis ZOX:[id number] DOB: 02-22-1976 DOA: 10/14/2012 PCP: Dorrene German, MD Neurologist: Angela Nevin, M.D. [facility] Fax [phone] Patient considering establishing [facility] with Onnie Graham, MD FAX:  [phone]  Assessment/Plan: 1. Breakthrough seizures/Seizure disorder--etiology unclear, record reports that husband suspected non-compliance, differential would include THC use, occult drug use, infection lowering seizure threshold (CAP). No further seizures. Dilantin decreased to 200 mg in the morning and 250 mg at bedtime and Keppra increased to 750 mg BID per neurology. Discussed with Dr. Roseanne Reno. Dilantin level high today, he recommends hold Dilantin today and resume tomorrow morning. He concurs with discharge. 2. Metabolic acidosis--resolved. Total ck normal, elevated lactic acid likely related to seizure. 3. Possible UTI--doubted, urinalysis equivocal, patient asymptomatic prior to admission. Culture pending. Will not Rx now, but will follow-up culture and contact patient if positive. 4. Possible Pneumonia--location inconsistent with aspiration. Started on Levaquin as inpatient and now asymptomatic. Cannot exclude CAP. Finish treatment with Zithromax, will avoid Levaquin as can lower seizure threshold. 5. Reported history of Bipolar disorder--stable, continue Seroquel. 6. Reported history of non-compliance--possibly ongoing per report in chart. 7. THC use--recommend cessation  Code Status: Full code Family Communication: Maudry Diego- Husband [phone] Disposition Plan: home  Brendia Sacks, MD  [facility] Team 2 Pager [phone]. If 8PM-8AM, please contact night-coverage at [email or web address], password [facility] 10/16/2012, 11:36 AM  LOS: 2 days   Brief narrative: 36 year old woman presented with history of seizures at home. Witnessed seizure in ED. Work up yielded sub-therapeutic dilantin level 12, metabolic  acidosis and HTN.  Consultants:  Neurology  Antibiotics:  Levaquin 11/12 >> 11/12  zithromax 11/13 >> 11/15  HPI/Subjective: Feels fine, no complaints, no seizures, ready to go home.  Objective: Filed Vitals:   10/15/12 1200 10/15/12 1410 10/15/12 2300 10/16/12 0649  BP:  126/86 95/60 103/69  Pulse: 80 78 83   Temp: 99 F (37.2 C) 98 F (36.7 C) 98.5 F (36.9 C) 98.4 F (36.9 C)  TempSrc: Core (Comment) Oral Oral Oral  Resp: 15 18 15 20   Height:      Weight:      SpO2: 100% 100%  100%    Intake/Output Summary (Last 24 hours) at 10/16/12 1136 Last data filed at 10/16/12 0624  Gross per 24 hour  Intake   1360 ml  Output   1250 ml  Net    110 ml   Filed Weights   10/14/12 1624 10/15/12 0000 10/15/12 1100  Weight: 76.5 kg (168 lb 10.4 oz) 77.9 kg (171 lb 11.8 oz) 79 kg (174 lb 2.6 oz)    Exam:  General:  Appears calm and comfortable Cardiovascular: RRR, no m/r/g. No LE edema. Respiratory: CTA bilaterally, no w/r/r. Normal respiratory effort. Psychiatric: grossly normal mood and affect, speech fluent and appropriate  Data Reviewed: Basic Metabolic Panel:  Lab 10/16/12 1027 10/15/12 0335 10/14/12 1658 10/14/12 1130  NA 140 136 -- 135  K 3.5 3.4* -- 4.6  CL 106 101 -- 103  CO2 26 25 -- 16*  GLUCOSE 83 122* -- 142*  BUN 8 7 -- 14  CREATININE 0.55 0.56 -- 0.55  CALCIUM 8.8 8.4 -- 9.0  MG -- -- 1.5 --  PHOS -- -- -- --   Liver Function Tests:  Lab 10/16/12 0520 10/14/12 1658  AST 17 27  ALT 18 22  ALKPHOS 85 105  BILITOT 0.2* 0.2*  PROT 6.3 6.8  ALBUMIN 3.3* 3.9   CBC:  Lab 10/15/12  0335 10/14/12 1600  WBC 7.7 12.2*  NEUTROABS -- 10.8*  HGB 9.3* 9.9*  HCT 29.2* 32.1*  MCV 83.0 84.9  PLT 222 244   Cardiac Enzymes:  Lab 10/14/12 1657  CKTOTAL 91  CKMB 1.4  CKMBINDEX --  TROPONINI --   CBG:  Lab 10/14/12 1115  GLUCAP 143*    Recent Results (from the past 240 hour(s))  MRSA PCR SCREENING     Status: Normal   Collection Time    10/14/12  4:33 PM      Component Value Range Status Comment   MRSA by PCR NEGATIVE  NEGATIVE Final   URINE CULTURE     Status: Normal (Preliminary result)   Collection Time   10/14/12  6:00 PM      Component Value Range Status Comment   Specimen Description URINE, CATHETERIZED   Final    Special Requests Normal   Final    Culture  Setup Time 10/15/2012 03:10   Final    Colony Count PENDING   Incomplete    Culture Culture reincubated for better growth   Final    Report Status PENDING   Incomplete   CULTURE, BLOOD (ROUTINE X 2)     Status: Normal (Preliminary result)   Collection Time   10/15/12 12:15 AM      Component Value Range Status Comment   Specimen Description BLOOD LEFT ARM   Final    Special Requests     Final    Value: BOTTLES DRAWN AEROBIC AND ANAEROBIC 3CC AER 2CC ANA   Culture  Setup Time 10/15/2012 08:49   Final    Culture     Final    Value:        BLOOD CULTURE RECEIVED NO GROWTH TO DATE CULTURE WILL BE HELD FOR 5 DAYS BEFORE ISSUING A FINAL NEGATIVE REPORT   Report Status PENDING   Incomplete      Studies: Dg Chest Port 1 View  10/14/2012  *RADIOLOGY REPORT*  Clinical Data: Fevers  PORTABLE CHEST - 1 VIEW  Comparison: 05/28/2012  Findings: There is rotational artifact.  Heart size is normal.  No pleural effusion or edema.  Airspace consolidation within the right upper lobe is identified, suspicious for pneumonia.  There is associated atelectasis.  Left lung is clear.  No pleural effusion or edema.  IMPRESSION:  1.  Upper lobe airspace disease.  Suspicious for pneumonia.   Original Report Authenticated By: Signa Kell, M.D.     Scheduled Meds:   . enoxaparin (LOVENOX) injection  40 mg Subcutaneous Q24H  . levETIRAcetam  750 mg Oral BID  . levofloxacin (LEVAQUIN) IV  750 mg Intravenous Q24H  . phenytoin  200 mg Oral q morning - 10a  . phenytoin  300 mg Oral QHS  . [COMPLETED] potassium chloride  40 mEq Oral Q4H  . QUEtiapine  200 mg Oral QHS   Continuous  Infusions:   . sodium chloride 50 mL/hr (10/15/12 1200)    Principal Problem:  *Seizures Active Problems:  Constipation, chronic  Metabolic acidosis  HTN (hypertension)  Bipolar 1 disorder, depressed  PNA (pneumonia)     Brendia Sacks, MD  [facility] Team 2 Pager [phone]. If 8PM-8AM, please contact night-coverage at [email or web address], password [facility] 10/16/2012, 11:36 AM  LOS: 2 days

## 2012-10-16 NOTE — Progress Notes (Signed)
Pharmacy: Dilantin    36 YOF admitted with seizures, on phenytoin/keppra prior to admission.  Seizures are currently controlled, Per MD patient complains of a headache this morning.   Phenytoin Dose PTA = 300 mg in AM and 200 mg in the PM   Labs  Albumin level 3.3  Phenytoin Level this morning 24.2, Based on Albumin this corrects to 31.8 which is above a goal Phenytoin level 10 -20 mcg/ml  Spoke with Dr. Roseanne Reno today - suspect elevated level is secondary to bolus given on admission as well as a history of non-compliance.   Plan:  1.) Hold dilantin today (AM dose already given - will hold evening dose) 2.) Repeat Phenytoin level in the morning   Ry Moody, Loma Messing PharmD Pager #: [phone] 10:58 AM 10/16/2012

## 2012-10-16 NOTE — Progress Notes (Signed)
Patient asking about doctor visit to discharge her this morning because she needs to get out of hospital.  Chilton Greathouse, RN states patient has removed her own IV.  I paged Dr. Irene Limbo and relayed to patient that he will be in to see her by 1pm.  She said to just give her the papers, work note and prescriptions and she doesn't need to see doctor.  I explained that Dr. Irene Limbo will be the person that writes her d/c instructions, work notes, as well as her prescriptions and then we will give those to her

## 2012-10-16 NOTE — Progress Notes (Signed)
Subjective: No recurrence of seizure activity. Patient is complaining of headache.  Objective: Current vital signs: BP 103/69  Pulse 83  Temp 98.4 F (36.9 C) (Oral)  Resp 20  Ht 5' 2.5" (1.588 m)  Wt 79 kg (174 lb 2.6 oz)  BMI 31.35 kg/m2  SpO2 100%  Neurologic Exam: Alert and in no acute distress. Patient is well-oriented to time as well as place. Speech is normal.  Lab Results: Results for orders placed during the hospital encounter of 10/14/12 (from the past 48 hour(s))  GLUCOSE, CAPILLARY     Status: Abnormal   Collection Time   10/14/12 11:15 AM      Component Value Range Comment   Glucose-Capillary 143 (*) 70 - 99 mg/dL   PHENYTOIN LEVEL, TOTAL     Status: Normal   Collection Time   10/14/12 11:30 AM      Component Value Range Comment   Phenytoin Lvl 12.0  10.0 - 20.0 ug/mL   BASIC METABOLIC PANEL     Status: Abnormal   Collection Time   10/14/12 11:30 AM      Component Value Range Comment   Sodium 135  135 - 145 mEq/L    Potassium 4.6  3.5 - 5.1 mEq/L SLIGHT HEMOLYSIS   Chloride 103  96 - 112 mEq/L    CO2 16 (*) 19 - 32 mEq/L    Glucose, Bld 142 (*) 70 - 99 mg/dL    BUN 14  6 - 23 mg/dL    Creatinine, Ser 8.29  0.50 - 1.10 mg/dL    Calcium 9.0  8.4 - 56.2 mg/dL    GFR calc non Af Amer >90  >90 mL/min    GFR calc Af Amer >90  >90 mL/min   CBC WITH DIFFERENTIAL     Status: Abnormal   Collection Time   10/14/12  4:00 PM      Component Value Range Comment   WBC 12.2 (*) 4.0 - 10.5 K/uL    RBC 3.78 (*) 3.87 - 5.11 MIL/uL    Hemoglobin 9.9 (*) 12.0 - 15.0 g/dL    HCT 13.0 (*) 86.5 - 46.0 %    MCV 84.9  78.0 - 100.0 fL    MCH 26.2  26.0 - 34.0 pg    MCHC 30.8  30.0 - 36.0 g/dL    RDW 78.4 (*) 69.6 - 15.5 %    Platelets 244  150 - 400 K/uL    Neutrophils Relative 89 (*) 43 - 77 %    Neutro Abs 10.8 (*) 1.7 - 7.7 K/uL    Lymphocytes Relative 6 (*) 12 - 46 %    Lymphs Abs 0.8  0.7 - 4.0 K/uL    Monocytes Relative 5  3 - 12 %    Monocytes Absolute 0.6  0.1  - 1.0 K/uL    Eosinophils Relative 0  0 - 5 %    Eosinophils Absolute 0.0  0.0 - 0.7 K/uL    Basophils Relative 0  0 - 1 %    Basophils Absolute 0.0  0.0 - 0.1 K/uL   MRSA PCR SCREENING     Status: Normal   Collection Time   10/14/12  4:33 PM      Component Value Range Comment   MRSA by PCR NEGATIVE  NEGATIVE   CK TOTAL AND CKMB     Status: Normal   Collection Time   10/14/12  4:57 PM      Component Value Range Comment  Total CK 91  7 - 177 U/L    CK, MB 1.4  0.3 - 4.0 ng/mL    Relative Index RELATIVE INDEX IS INVALID  0.0 - 2.5   LACTIC ACID, PLASMA     Status: Abnormal   Collection Time   10/14/12  4:57 PM      Component Value Range Comment   Lactic Acid, Venous 3.1 (*) 0.5 - 2.2 mmol/L   HEPATIC FUNCTION PANEL     Status: Abnormal   Collection Time   10/14/12  4:58 PM      Component Value Range Comment   Total Protein 6.8  6.0 - 8.3 g/dL    Albumin 3.9  3.5 - 5.2 g/dL    AST 27  0 - 37 U/L    ALT 22  0 - 35 U/L    Alkaline Phosphatase 105  39 - 117 U/L    Total Bilirubin 0.2 (*) 0.3 - 1.2 mg/dL    Bilirubin, Direct 0.1  0.0 - 0.3 mg/dL    Indirect Bilirubin 0.1 (*) 0.3 - 0.9 mg/dL   MAGNESIUM     Status: Normal   Collection Time   10/14/12  4:58 PM      Component Value Range Comment   Magnesium 1.5  1.5 - 2.5 mg/dL   TSH     Status: Normal   Collection Time   10/14/12  4:58 PM      Component Value Range Comment   TSH 0.901  0.350 - 4.500 uIU/mL   ETHANOL     Status: Normal   Collection Time   10/14/12  4:58 PM      Component Value Range Comment   Alcohol, Ethyl (B) <11  0 - 11 mg/dL   URINALYSIS, ROUTINE W REFLEX MICROSCOPIC     Status: Abnormal   Collection Time   10/14/12  6:00 PM      Component Value Range Comment   Color, Urine YELLOW  YELLOW    APPearance CLEAR  CLEAR    Specific Gravity, Urine 1.019  1.005 - 1.030    pH 5.5  5.0 - 8.0    Glucose, UA NEGATIVE  NEGATIVE mg/dL    Hgb urine dipstick NEGATIVE  NEGATIVE    Bilirubin Urine NEGATIVE   NEGATIVE    Ketones, ur NEGATIVE  NEGATIVE mg/dL    Protein, ur NEGATIVE  NEGATIVE mg/dL    Urobilinogen, UA 0.2  0.0 - 1.0 mg/dL    Nitrite POSITIVE (*) NEGATIVE    Leukocytes, UA NEGATIVE  NEGATIVE   URINE CULTURE     Status: Normal (Preliminary result)   Collection Time   10/14/12  6:00 PM      Component Value Range Comment   Specimen Description URINE, CATHETERIZED      Special Requests Normal      Culture  Setup Time 10/15/2012 03:10      Colony Count PENDING      Culture Culture reincubated for better growth      Report Status PENDING     URINE RAPID DRUG SCREEN (HOSP PERFORMED)     Status: Abnormal   Collection Time   10/14/12  6:00 PM      Component Value Range Comment   Opiates NONE DETECTED  NONE DETECTED    Cocaine NONE DETECTED  NONE DETECTED    Benzodiazepines POSITIVE (*) NONE DETECTED    Amphetamines NONE DETECTED  NONE DETECTED    Tetrahydrocannabinol POSITIVE (*) NONE DETECTED  Barbiturates NONE DETECTED  NONE DETECTED   URINE MICROSCOPIC-ADD ON     Status: Abnormal   Collection Time   10/14/12  6:00 PM      Component Value Range Comment   WBC, UA 0-2  <3 WBC/hpf    Bacteria, UA MANY (*) RARE   CULTURE, BLOOD (ROUTINE X 2)     Status: Normal (Preliminary result)   Collection Time   10/15/12 12:15 AM      Component Value Range Comment   Specimen Description BLOOD LEFT ARM      Special Requests        Value: BOTTLES DRAWN AEROBIC AND ANAEROBIC 3CC AER 2CC ANA   Culture  Setup Time 10/15/2012 08:49      Culture        Value:        BLOOD CULTURE RECEIVED NO GROWTH TO DATE CULTURE WILL BE HELD FOR 5 DAYS BEFORE ISSUING A FINAL NEGATIVE REPORT   Report Status PENDING     CBC     Status: Abnormal   Collection Time   10/15/12  3:35 AM      Component Value Range Comment   WBC 7.7  4.0 - 10.5 K/uL    RBC 3.52 (*) 3.87 - 5.11 MIL/uL    Hemoglobin 9.3 (*) 12.0 - 15.0 g/dL    HCT 16.1 (*) 09.6 - 46.0 %    MCV 83.0  78.0 - 100.0 fL    MCH 26.4  26.0 - 34.0 pg     MCHC 31.8  30.0 - 36.0 g/dL    RDW 04.5 (*) 40.9 - 15.5 %    Platelets 222  150 - 400 K/uL   BASIC METABOLIC PANEL     Status: Abnormal   Collection Time   10/15/12  3:35 AM      Component Value Range Comment   Sodium 136  135 - 145 mEq/L    Potassium 3.4 (*) 3.5 - 5.1 mEq/L    Chloride 101  96 - 112 mEq/L    CO2 25  19 - 32 mEq/L    Glucose, Bld 122 (*) 70 - 99 mg/dL    BUN 7  6 - 23 mg/dL    Creatinine, Ser 8.11  0.50 - 1.10 mg/dL    Calcium 8.4  8.4 - 91.4 mg/dL    GFR calc non Af Amer >90  >90 mL/min    GFR calc Af Amer >90  >90 mL/min   PHENYTOIN LEVEL, TOTAL     Status: Abnormal   Collection Time   10/15/12  3:35 AM      Component Value Range Comment   Phenytoin Lvl 24.3 (*) 10.0 - 20.0 ug/mL   BASIC METABOLIC PANEL     Status: Normal   Collection Time   10/16/12  5:20 AM      Component Value Range Comment   Sodium 140  135 - 145 mEq/L    Potassium 3.5  3.5 - 5.1 mEq/L    Chloride 106  96 - 112 mEq/L    CO2 26  19 - 32 mEq/L    Glucose, Bld 83  70 - 99 mg/dL    BUN 8  6 - 23 mg/dL    Creatinine, Ser 7.82  0.50 - 1.10 mg/dL    Calcium 8.8  8.4 - 95.6 mg/dL    GFR calc non Af Amer >90  >90 mL/min    GFR calc Af Amer >90  >90 mL/min  PHENYTOIN LEVEL, TOTAL     Status: Abnormal   Collection Time   10/16/12  5:20 AM      Component Value Range Comment   Phenytoin Lvl 24.2 (*) 10.0 - 20.0 ug/mL   HEPATIC FUNCTION PANEL     Status: Abnormal   Collection Time   10/16/12  5:20 AM      Component Value Range Comment   Total Protein 6.3  6.0 - 8.3 g/dL    Albumin 3.3 (*) 3.5 - 5.2 g/dL    AST 17  0 - 37 U/L    ALT 18  0 - 35 U/L    Alkaline Phosphatase 85  39 - 117 U/L    Total Bilirubin 0.2 (*) 0.3 - 1.2 mg/dL    Bilirubin, Direct <1.6  0.0 - 0.3 mg/dL    Indirect Bilirubin NOT CALCULATED  0.3 - 0.9 mg/dL     Studies/Results: Dg Chest Port 1 View  10/14/2012  *RADIOLOGY REPORT*  Clinical Data: Fevers  PORTABLE CHEST - 1 VIEW  Comparison: 05/28/2012  Findings:  There is rotational artifact.  Heart size is normal.  No pleural effusion or edema.  Airspace consolidation within the right upper lobe is identified, suspicious for pneumonia.  There is associated atelectasis.  Left lung is clear.  No pleural effusion or edema.  IMPRESSION:  1.  Upper lobe airspace disease.  Suspicious for pneumonia.   Original Report Authenticated By: Signa Kell, M.D.     Medications:  Scheduled:   . enoxaparin (LOVENOX) injection  40 mg Subcutaneous Q24H  . levETIRAcetam  750 mg Oral BID  . levofloxacin (LEVAQUIN) IV  750 mg Intravenous Q24H  . phenytoin  200 mg Oral q morning - 10a  . phenytoin  300 mg Oral QHS  . [COMPLETED] potassium chloride  40 mEq Oral Q4H  . QUEtiapine  200 mg Oral QHS  . [DISCONTINUED] azithromycin  500 mg Intravenous Q24H  . [DISCONTINUED] levetiracetam  750 mg Intravenous BID  . [DISCONTINUED] phenytoin (DILANTIN) IV  100 mg Intravenous Q8H   XWR:[id number], acetaminophen, ondansetron (ZOFRAN) IV, ondansetron, traMADol  Assessment/Plan: Breakthrough seizures, controlled. Dilantin toxicity.  Recommendations: 1. Continue Keppra at 750 mg twice a day. 2. Reduce Dilantin to 200 mg in the morning and 250 mg at bedtime. 3. Followup with neurologist at the epilepsy clinic [facility].  C.R. Roseanne Reno, MD [facility] [phone]  10/16/2012  10:23 AM

## 2012-10-17 LAB — URINE CULTURE
Colony Count: >=100000
Special Requests: NORMAL

## 2012-10-21 LAB — CULTURE, BLOOD (ROUTINE X 2): Culture: NO GROWTH

## 2012-10-22 NOTE — Progress Notes (Signed)
Urine culture reviewed.  Notified patient by telephone of culture result. She has symptoms. Advised antibiotic. She has no allergies to antibiotics.  Called in Rx to [facility].  Amoxicillin 500 mg po BID #6 No refills  Discussed with Dr. Scarlett Presto acceptable choice in view of known seizure disorder.  Brendia Sacks, MD [facility] [phone]

## 2013-04-23 ENCOUNTER — Emergency Department (HOSPITAL_COMMUNITY)
Admission: EM | Admit: 2013-04-23 | Discharge: 2013-04-23 | Disposition: A | Discharge status: Home / Self Care | Payer: Self-pay | Attending: Emergency Medicine | Admitting: Emergency Medicine

## 2013-04-23 ENCOUNTER — Encounter (HOSPITAL_COMMUNITY): Payer: Self-pay | Admitting: Family Medicine

## 2013-04-23 DIAGNOSIS — Z79899 Other long term (current) drug therapy: Secondary | ICD-10-CM | POA: Insufficient documentation

## 2013-04-23 DIAGNOSIS — J3489 Other specified disorders of nose and nasal sinuses: Secondary | ICD-10-CM | POA: Insufficient documentation

## 2013-04-23 DIAGNOSIS — F17200 Nicotine dependence, unspecified, uncomplicated: Secondary | ICD-10-CM | POA: Insufficient documentation

## 2013-04-23 DIAGNOSIS — R6889 Other general symptoms and signs: Secondary | ICD-10-CM | POA: Insufficient documentation

## 2013-04-23 DIAGNOSIS — G40909 Epilepsy, unspecified, not intractable, without status epilepticus: Secondary | ICD-10-CM

## 2013-04-23 DIAGNOSIS — R51 Headache: Secondary | ICD-10-CM | POA: Insufficient documentation

## 2013-04-23 LAB — POCT I-STAT, CHEM 8
Chloride: 107 mEq/L (ref 96–112)
HCT: 32 % — ABNORMAL LOW (ref 36.0–46.0)
Hemoglobin: 10.9 g/dL — ABNORMAL LOW (ref 12.0–15.0)
Potassium: 3.5 mEq/L (ref 3.5–5.1)
Sodium: 142 mEq/L (ref 135–145)

## 2013-04-23 LAB — CBC WITH DIFFERENTIAL/PLATELET
Basophils Absolute: 0 10*3/uL (ref 0.0–0.1)
Basophils Relative: 0 % (ref 0–1)
Eosinophils Absolute: 0.2 10*3/uL (ref 0.0–0.7)
Hemoglobin: 9.6 g/dL — ABNORMAL LOW (ref 12.0–15.0)
MCH: 25.7 pg — ABNORMAL LOW (ref 26.0–34.0)
MCHC: 31.8 g/dL (ref 30.0–36.0)
Neutro Abs: 2.7 10*3/uL (ref 1.7–7.7)
Neutrophils Relative %: 47 % (ref 43–77)
Platelets: 246 10*3/uL (ref 150–400)
RBC: 3.73 MIL/uL — ABNORMAL LOW (ref 3.87–5.11)

## 2013-04-23 MED ORDER — LEVETIRACETAM 1000 MG PO TABS: 1000.0000 mg | ORAL_TABLET | Freq: Two times a day (BID) | ORAL | Status: DC

## 2013-04-23 MED ORDER — LEVETIRACETAM 500 MG/5ML IV SOLN
500.0000 mg | INTRAVENOUS | Status: AC
  Administered 2013-04-23: 500 mg via INTRAVENOUS
  Filled 2013-04-23: qty 5

## 2013-04-23 MED ORDER — KETOROLAC TROMETHAMINE 30 MG/ML IJ SOLN
30.0000 mg | Freq: Once | INTRAMUSCULAR | Status: AC
  Administered 2013-04-23: 30 mg via INTRAVENOUS
  Filled 2013-04-23: qty 1
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
## 2013-04-23 NOTE — ED Provider Notes (Signed)
Medical screening examination/treatment/procedure(s) were performed by non-physician practitioner and as supervising physician I was immediately available for consultation/collaboration.  Jabar Krysiak R. Vermelle Cammarata, MD 04/23/13 1555 

## 2013-04-23 NOTE — ED Notes (Signed)
Per pt family she has had multiple focal seizures since yesterday. sts she recently started taking allergy medication and he thinks it has interfered with her medication. sts pt has been taking her seizure medication like she is suppose to.

## 2013-04-23 NOTE — ED Notes (Addendum)
Pt c/o headache "all over head" that started today. Pt sts she had approx 5-6 seizures this morning. Pt has hx of seizures, sts she takes keppra and has been taking it as prescribed. Pt c/o allergies the past couple weeks, and has started to take something OTC for relief. Pt in nad, skin warm and dry, resp e/u.

## 2013-04-28 ENCOUNTER — Emergency Department (HOSPITAL_COMMUNITY)
Admission: EM | Admit: 2013-04-28 | Discharge: 2013-04-28 | Disposition: A | Discharge status: Home / Self Care | Payer: Managed Care, Other (non HMO) | Attending: Emergency Medicine | Admitting: Emergency Medicine

## 2013-04-28 ENCOUNTER — Encounter (HOSPITAL_COMMUNITY): Payer: Self-pay | Admitting: Emergency Medicine

## 2013-04-28 DIAGNOSIS — F17200 Nicotine dependence, unspecified, uncomplicated: Secondary | ICD-10-CM | POA: Insufficient documentation

## 2013-04-28 DIAGNOSIS — Z8719 Personal history of other diseases of the digestive system: Secondary | ICD-10-CM | POA: Insufficient documentation

## 2013-04-28 DIAGNOSIS — Z79899 Other long term (current) drug therapy: Secondary | ICD-10-CM | POA: Insufficient documentation

## 2013-04-28 DIAGNOSIS — R569 Unspecified convulsions: Secondary | ICD-10-CM | POA: Insufficient documentation

## 2013-04-28 LAB — CBC WITH DIFFERENTIAL/PLATELET
Basophils Relative: 0 % (ref 0–1)
HCT: 34.2 % — ABNORMAL LOW (ref 36.0–46.0)
Hemoglobin: 10.7 g/dL — ABNORMAL LOW (ref 12.0–15.0)
Lymphocytes Relative: 11 % — ABNORMAL LOW (ref 12–46)
Lymphs Abs: 0.9 10*3/uL (ref 0.7–4.0)
MCHC: 31.3 g/dL (ref 30.0–36.0)
Monocytes Relative: 4 % (ref 3–12)
Neutro Abs: 6.7 10*3/uL (ref 1.7–7.7)
Neutrophils Relative %: 85 % — ABNORMAL HIGH (ref 43–77)
RBC: 4.15 MIL/uL (ref 3.87–5.11)
WBC: 7.9 10*3/uL (ref 4.0–10.5)

## 2013-04-28 LAB — COMPREHENSIVE METABOLIC PANEL
Albumin: 4.2 g/dL (ref 3.5–5.2)
Alkaline Phosphatase: 74 U/L (ref 39–117)
BUN: 14 mg/dL (ref 6–23)
CO2: 24 mEq/L (ref 19–32)
Chloride: 103 mEq/L (ref 96–112)
GFR calc non Af Amer: >90 mL/min (ref >90)
Potassium: 4 mEq/L (ref 3.5–5.1)
Total Bilirubin: 0.3 mg/dL (ref 0.3–1.2)

## 2013-04-28 MED ORDER — SODIUM CHLORIDE 0.9 % IV BOLUS (SEPSIS): 1000.0000 mL | Freq: Once | INTRAVENOUS | Status: DC

## 2013-04-28 MED ORDER — ONDANSETRON 4 MG PO TBDP
8.0000 mg | ORAL_TABLET | Freq: Once | ORAL | Status: DC
  Filled 2013-04-28: qty 2

## 2013-04-28 MED ORDER — ONDANSETRON HCL 4 MG/2ML IJ SOLN: 4.0000 mg | Freq: Once | INTRAMUSCULAR | Status: DC

## 2013-04-28 MED ORDER — HYDROMORPHONE HCL PF 1 MG/ML IJ SOLN: 0.5000 mg | Freq: Once | INTRAMUSCULAR | Status: DC

## 2013-04-28 MED ORDER — ACETAMINOPHEN 500 MG PO TABS
1000.0000 mg | ORAL_TABLET | Freq: Once | ORAL | Status: DC
  Filled 2013-04-28: qty 2

## 2013-04-28 MED ORDER — MORPHINE SULFATE 4 MG/ML IJ SOLN
4.0000 mg | Freq: Once | INTRAMUSCULAR | Status: DC
  Filled 2013-04-28: qty 1

## 2013-04-28 NOTE — ED Notes (Signed)
IV team unsuccessful at IV insertion. Will notify PA.
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
## 2013-04-28 NOTE — ED Notes (Signed)
Pt states she is unable to take morphine. PA at bedside. Pt not cooperating with PA questions. Pt states she is ready to go home.

## 2013-04-28 NOTE — ED Notes (Signed)
Attempted to start PIV x 2 by 2 RN's with no success. IV Team paged for an IV start.

## 2013-04-28 NOTE — ED Notes (Signed)
IV team at bedside 

## 2013-04-28 NOTE — Discharge Instructions (Signed)
Take your Keppra as directed. Follow up with your Neurologist and primary care Doctor. Refer to attached documents for more information regarding your diagnosis. Return to the ED with worsening or concerning symptoms.
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
## 2013-04-28 NOTE — ED Notes (Signed)
IV team unable to start IV. PA notified.

## 2013-04-28 NOTE — ED Notes (Signed)
Pt left before receiving discharge instructions.

## 2013-04-29 NOTE — ED Provider Notes (Signed)
Medical screening examination/treatment/procedure(s) were performed by non-physician practitioner and as supervising physician I was immediately available for consultation/collaboration.    Vida Roller, MD 04/29/13 [phone]

## 2013-06-25 ENCOUNTER — Inpatient Hospital Stay (HOSPITAL_COMMUNITY)
Admission: AD | Admit: 2013-06-25 | Discharge: 2013-06-25 | Disposition: A | Discharge status: Home / Self Care | Payer: Managed Care, Other (non HMO) | Source: Ambulatory Visit | Attending: Obstetrics & Gynecology | Admitting: Obstetrics & Gynecology

## 2013-06-25 ENCOUNTER — Encounter (HOSPITAL_COMMUNITY): Payer: Self-pay | Admitting: *Deleted

## 2013-06-25 DIAGNOSIS — K5900 Constipation, unspecified: Secondary | ICD-10-CM

## 2013-06-25 DIAGNOSIS — K648 Other hemorrhoids: Secondary | ICD-10-CM | POA: Insufficient documentation

## 2013-06-25 DIAGNOSIS — K6289 Other specified diseases of anus and rectum: Secondary | ICD-10-CM | POA: Insufficient documentation

## 2013-06-25 DIAGNOSIS — K649 Unspecified hemorrhoids: Secondary | ICD-10-CM

## 2013-06-25 DIAGNOSIS — R197 Diarrhea, unspecified: Secondary | ICD-10-CM | POA: Insufficient documentation

## 2013-06-25 HISTORY — DX: Herpesviral infection, unspecified: B00.9

## 2013-06-25 LAB — WET PREP, GENITAL
Clue Cells Wet Prep HPF POC: NONE SEEN
Trich, Wet Prep: NONE SEEN

## 2013-06-25 MED ORDER — POLYETHYLENE GLYCOL 3350 17 GM/SCOOP PO POWD: 17.0000 g | Freq: Every day | ORAL | Status: DC

## 2013-06-25 MED ORDER — DOCUSATE SODIUM 100 MG PO CAPS: 100.0000 mg | ORAL_CAPSULE | Freq: Two times a day (BID) | ORAL | Status: DC

## 2013-06-25 MED ORDER — HYDROCORTISONE 2.5 % RE CREA: TOPICAL_CREAM | RECTAL | Status: DC

## 2013-06-25 MED ORDER — HYDROCORTISONE 2.5 % RE CREA
TOPICAL_CREAM | Freq: Once | RECTAL | Status: AC
  Administered 2013-06-25: 18:00:00 via RECTAL
  Filled 2013-06-25: qty 28.35
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
## 2013-06-25 NOTE — MAU Note (Signed)
Patient states she has had rectal bleeding for 4 days with BM's. Has anal burning and pain. Patient has a history of HSV and is afraid it is an outbreak.

## 2013-06-26 LAB — GC/CHLAMYDIA PROBE AMP
CT Probe RNA: NEGATIVE
GC Probe RNA: NEGATIVE

## 2014-10-05 ENCOUNTER — Encounter (HOSPITAL_COMMUNITY): Payer: Self-pay | Admitting: *Deleted

## 2014-10-13 ENCOUNTER — Encounter (HOSPITAL_COMMUNITY): Payer: Self-pay | Admitting: *Deleted

## 2014-10-13 ENCOUNTER — Emergency Department (HOSPITAL_COMMUNITY)
Admission: EM | Admit: 2014-10-13 | Discharge: 2014-10-13 | Disposition: A | Discharge status: Home / Self Care | Payer: Medicare Other | Attending: Emergency Medicine | Admitting: Emergency Medicine

## 2014-10-13 DIAGNOSIS — K5900 Constipation, unspecified: Secondary | ICD-10-CM | POA: Insufficient documentation

## 2014-10-13 DIAGNOSIS — R569 Unspecified convulsions: Secondary | ICD-10-CM | POA: Diagnosis present

## 2014-10-13 DIAGNOSIS — Z720 Tobacco use: Secondary | ICD-10-CM | POA: Diagnosis not present

## 2014-10-13 DIAGNOSIS — Z7952 Long term (current) use of systemic steroids: Secondary | ICD-10-CM | POA: Diagnosis not present

## 2014-10-13 DIAGNOSIS — Z3202 Encounter for pregnancy test, result negative: Secondary | ICD-10-CM | POA: Insufficient documentation

## 2014-10-13 DIAGNOSIS — G40909 Epilepsy, unspecified, not intractable, without status epilepticus: Secondary | ICD-10-CM | POA: Insufficient documentation

## 2014-10-13 DIAGNOSIS — Z79899 Other long term (current) drug therapy: Secondary | ICD-10-CM | POA: Diagnosis not present

## 2014-10-13 DIAGNOSIS — Z8619 Personal history of other infectious and parasitic diseases: Secondary | ICD-10-CM | POA: Diagnosis not present

## 2014-10-13 LAB — CBG MONITORING, ED: GLUCOSE-CAPILLARY: 144 mg/dL — AB (ref 70–99)

## 2014-10-13 LAB — RAPID URINE DRUG SCREEN, HOSP PERFORMED
Amphetamines: NOT DETECTED
BARBITURATES: NOT DETECTED
Benzodiazepines: NOT DETECTED
Cocaine: NOT DETECTED
Opiates: NOT DETECTED
Tetrahydrocannabinol: POSITIVE — AB

## 2014-10-13 LAB — I-STAT CHEM 8, ED
BUN: 15 mg/dL (ref 6–23)
CHLORIDE: 109 meq/L (ref 96–112)
Calcium, Ion: 1.14 mmol/L (ref 1.12–1.23)
Creatinine, Ser: 0.7 mg/dL (ref 0.50–1.10)
Glucose, Bld: 127 mg/dL — ABNORMAL HIGH (ref 70–99)
HEMATOCRIT: 40 % (ref 36.0–46.0)
Hemoglobin: 13.6 g/dL (ref 12.0–15.0)
Potassium: 4.1 mEq/L (ref 3.7–5.3)
SODIUM: 140 meq/L (ref 137–147)
TCO2: 20 mmol/L (ref 0–100)

## 2014-10-13 LAB — I-STAT CG4 LACTIC ACID, ED: Lactic Acid, Venous: 4.27 mmol/L — ABNORMAL HIGH (ref 0.5–2.2)

## 2014-10-13 LAB — CBC WITH DIFFERENTIAL/PLATELET
BASOS PCT: 0 % (ref 0–1)
Basophils Absolute: 0 10*3/uL (ref 0.0–0.1)
EOS PCT: 0 % (ref 0–5)
Eosinophils Absolute: 0 10*3/uL (ref 0.0–0.7)
HEMATOCRIT: 38.1 % (ref 36.0–46.0)
HEMOGLOBIN: 11.9 g/dL — AB (ref 12.0–15.0)
LYMPHS ABS: 0.6 10*3/uL — AB (ref 0.7–4.0)
Lymphocytes Relative: 7 % — ABNORMAL LOW (ref 12–46)
MCH: 28.7 pg (ref 26.0–34.0)
MCHC: 31.2 g/dL (ref 30.0–36.0)
MCV: 92 fL (ref 78.0–100.0)
MONOS PCT: 3 % (ref 3–12)
Monocytes Absolute: 0.3 10*3/uL (ref 0.1–1.0)
NEUTROS ABS: 7.5 10*3/uL (ref 1.7–7.7)
Neutrophils Relative %: 90 % — ABNORMAL HIGH (ref 43–77)
Platelets: 226 10*3/uL (ref 150–400)
RBC: 4.14 MIL/uL (ref 3.87–5.11)
RDW: 22 % — AB (ref 11.5–15.5)
Smear Review: ADEQUATE
WBC: 8.4 10*3/uL (ref 4.0–10.5)

## 2014-10-13 LAB — COMPREHENSIVE METABOLIC PANEL
ALK PHOS: 86 U/L (ref 39–117)
ALT: 30 U/L (ref 0–35)
ANION GAP: 18 — AB (ref 5–15)
AST: 38 U/L — ABNORMAL HIGH (ref 0–37)
Albumin: 3.8 g/dL (ref 3.5–5.2)
BUN: 14 mg/dL (ref 6–23)
CHLORIDE: 102 meq/L (ref 96–112)
CO2: 19 meq/L (ref 19–32)
Calcium: 9.3 mg/dL (ref 8.4–10.5)
Creatinine, Ser: 0.64 mg/dL (ref 0.50–1.10)
GFR calc non Af Amer: >90 mL/min (ref >90)
GLUCOSE: 124 mg/dL — AB (ref 70–99)
POTASSIUM: 4.3 meq/L (ref 3.7–5.3)
Sodium: 139 mEq/L (ref 137–147)
Total Protein: 7.2 g/dL (ref 6.0–8.3)

## 2014-10-13 LAB — URINALYSIS, ROUTINE W REFLEX MICROSCOPIC
BILIRUBIN URINE: NEGATIVE
Glucose, UA: NEGATIVE mg/dL
Ketones, ur: NEGATIVE mg/dL
Leukocytes, UA: NEGATIVE
NITRITE: NEGATIVE
PROTEIN: 100 mg/dL — AB
SPECIFIC GRAVITY, URINE: 1.023 (ref 1.005–1.030)
UROBILINOGEN UA: 0.2 mg/dL (ref 0.0–1.0)
pH: 6 (ref 5.0–8.0)

## 2014-10-13 LAB — ETHANOL

## 2014-10-13 LAB — URINE MICROSCOPIC-ADD ON

## 2014-10-13 LAB — PREGNANCY, URINE: PREG TEST UR: NEGATIVE

## 2014-10-13 LAB — PHENYTOIN LEVEL, TOTAL

## 2014-10-13 MED ORDER — ACETAMINOPHEN 500 MG PO TABS
1000.0000 mg | ORAL_TABLET | Freq: Once | ORAL | Status: AC
  Administered 2014-10-13: 1000 mg via ORAL
  Filled 2014-10-13: qty 2

## 2014-10-13 MED ORDER — LORAZEPAM 1 MG PO TABS
0.5000 mg | ORAL_TABLET | Freq: Once | ORAL | Status: AC
  Administered 2014-10-13: 0.5 mg via ORAL
  Filled 2014-10-13: qty 1
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
## 2014-10-13 NOTE — ED Notes (Signed)
Pt's family member is declining treatment at [facility] and wants to be treated at [facility]. Dr. Sharol Given at bedside and aware of pt's request. Will continue to monitor.

## 2014-10-13 NOTE — ED Notes (Signed)
Per EMS: pt coming from home with c/o seizure. Family member reports 8 seizures 45 seconds each, longest lasted 2 minutes, family member states he gave pt an ativan pill in between seizures, unknown if pt was able to swallow medication. Pt would not tolerate NRB, pt was 98% on room air. Pt responded to pain, pt would not answer questions, airway intact, pt alert, respirations equal and unlabored, skin warm and dry. Pt was incontinent of urine, no oral trauma noted

## 2014-10-13 NOTE — ED Notes (Signed)
Pt ambulatory to restroom unassisted with steady gait

## 2014-10-13 NOTE — ED Notes (Signed)
The patient is unable to give specimen at this time. The RN in charge is aware. 

## 2014-10-13 NOTE — Discharge Instructions (Signed)

## 2014-10-30 ENCOUNTER — Emergency Department (HOSPITAL_COMMUNITY)
Admission: EM | Admit: 2014-10-30 | Discharge: 2014-10-30 | Disposition: A | Discharge status: Home / Self Care | Payer: Medicare Other | Attending: Emergency Medicine | Admitting: Emergency Medicine

## 2014-10-30 ENCOUNTER — Encounter (HOSPITAL_COMMUNITY): Payer: Self-pay | Admitting: *Deleted

## 2014-10-30 DIAGNOSIS — Z79899 Other long term (current) drug therapy: Secondary | ICD-10-CM | POA: Diagnosis not present

## 2014-10-30 DIAGNOSIS — G40909 Epilepsy, unspecified, not intractable, without status epilepticus: Secondary | ICD-10-CM | POA: Diagnosis not present

## 2014-10-30 DIAGNOSIS — K5900 Constipation, unspecified: Secondary | ICD-10-CM | POA: Insufficient documentation

## 2014-10-30 DIAGNOSIS — Z8619 Personal history of other infectious and parasitic diseases: Secondary | ICD-10-CM | POA: Diagnosis not present

## 2014-10-30 DIAGNOSIS — R519 Headache, unspecified: Secondary | ICD-10-CM

## 2014-10-30 DIAGNOSIS — Z7952 Long term (current) use of systemic steroids: Secondary | ICD-10-CM | POA: Diagnosis not present

## 2014-10-30 DIAGNOSIS — R51 Headache: Secondary | ICD-10-CM

## 2014-10-30 DIAGNOSIS — Z720 Tobacco use: Secondary | ICD-10-CM | POA: Insufficient documentation

## 2014-10-30 DIAGNOSIS — R569 Unspecified convulsions: Secondary | ICD-10-CM

## 2014-10-30 LAB — CARBAMAZEPINE LEVEL, TOTAL: CARBAMAZEPINE LVL: 8.1 ug/mL (ref 4.0–12.0)

## 2014-10-30 MED ORDER — OXYCODONE-ACETAMINOPHEN 5-325 MG PO TABS
2.0000 | ORAL_TABLET | Freq: Once | ORAL | Status: AC
  Administered 2014-10-30: 2 via ORAL
  Filled 2014-10-30: qty 2

## 2014-10-30 MED ORDER — IBUPROFEN 600 MG PO TABS: 600.0000 mg | ORAL_TABLET | Freq: Three times a day (TID) | ORAL | Status: DC | PRN

## 2014-10-30 MED ORDER — LEVETIRACETAM IN NACL 1000 MG/100ML IV SOLN
1000.0000 mg | INTRAVENOUS | Status: DC
  Filled 2014-10-30 (×2): qty 100

## 2014-10-30 MED ORDER — LORAZEPAM 1 MG PO TABS
1.0000 mg | ORAL_TABLET | Freq: Once | ORAL | Status: AC
  Administered 2014-10-30: 1 mg via ORAL
  Filled 2014-10-30: qty 1
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
## 2014-10-30 NOTE — ED Notes (Signed)
Pt. Stated, "I am ready to go home."

## 2015-08-19 ENCOUNTER — Other Ambulatory Visit: Payer: Self-pay | Admitting: *Deleted

## 2015-08-19 DIAGNOSIS — D259 Leiomyoma of uterus, unspecified: Secondary | ICD-10-CM

## 2015-08-20 ENCOUNTER — Telehealth: Payer: Self-pay | Admitting: *Deleted

## 2015-08-20 NOTE — Telephone Encounter (Signed)
Ultrasound scheduled for September 21 @ 10:30am.   Contacted patient with ultrasound appointment, special instructions given.  Pt verbalizes understanding.

## 2015-08-25 ENCOUNTER — Ambulatory Visit (HOSPITAL_COMMUNITY)
Admission: RE | Admit: 2015-08-25 | Discharge: 2015-08-25 | Disposition: A | Discharge status: Home / Self Care | Payer: Medicare Other | Source: Ambulatory Visit | Attending: Obstetrics & Gynecology | Admitting: Obstetrics & Gynecology

## 2015-08-25 ENCOUNTER — Encounter: Payer: Self-pay | Admitting: Obstetrics & Gynecology

## 2015-08-25 DIAGNOSIS — N941 Dyspareunia: Secondary | ICD-10-CM | POA: Insufficient documentation

## 2015-08-25 DIAGNOSIS — D259 Leiomyoma of uterus, unspecified: Secondary | ICD-10-CM | POA: Diagnosis present

## 2015-08-27 ENCOUNTER — Telehealth: Payer: Self-pay | Admitting: *Deleted

## 2015-08-27 NOTE — Telephone Encounter (Signed)
Contacted patient, results and recommendations by Dr. Harolyn Rutherford reviewed with patient.  Informed patient of appointment in clinic 09/23/15 @ 12:45.  Pt verbalizes understanding and has no further questions.

## 2015-09-23 ENCOUNTER — Other Ambulatory Visit (HOSPITAL_COMMUNITY)
Admission: RE | Admit: 2015-09-23 | Discharge: 2015-09-23 | Disposition: A | Discharge status: Home / Self Care | Payer: Medicare Other | Source: Ambulatory Visit | Attending: Obstetrics & Gynecology | Admitting: Obstetrics & Gynecology

## 2015-09-23 ENCOUNTER — Encounter: Payer: Self-pay | Admitting: Obstetrics & Gynecology

## 2015-09-23 ENCOUNTER — Telehealth: Payer: Self-pay | Admitting: *Deleted

## 2015-09-23 ENCOUNTER — Ambulatory Visit (INDEPENDENT_AMBULATORY_CARE_PROVIDER_SITE_OTHER): Payer: Medicare Other | Admitting: Obstetrics & Gynecology

## 2015-09-23 VITALS — BP 133/82 | HR 74 | Temp 98.4°F | Ht 62.0 in | Wt 191.2 lb

## 2015-09-23 DIAGNOSIS — R102 Pelvic and perineal pain: Secondary | ICD-10-CM

## 2015-09-23 DIAGNOSIS — Z01419 Encounter for gynecological examination (general) (routine) without abnormal findings: Secondary | ICD-10-CM

## 2015-09-23 DIAGNOSIS — Z1151 Encounter for screening for human papillomavirus (HPV): Secondary | ICD-10-CM | POA: Diagnosis present

## 2015-09-23 DIAGNOSIS — F17200 Nicotine dependence, unspecified, uncomplicated: Secondary | ICD-10-CM

## 2015-09-23 DIAGNOSIS — Z124 Encounter for screening for malignant neoplasm of cervix: Secondary | ICD-10-CM

## 2015-09-23 DIAGNOSIS — D259 Leiomyoma of uterus, unspecified: Secondary | ICD-10-CM | POA: Diagnosis not present

## 2015-09-23 LAB — POCT URINALYSIS DIP (DEVICE)
BILIRUBIN URINE: NEGATIVE
GLUCOSE, UA: NEGATIVE mg/dL
HGB URINE DIPSTICK: NEGATIVE
KETONES UR: NEGATIVE mg/dL
LEUKOCYTES UA: NEGATIVE
Nitrite: NEGATIVE
Protein, ur: NEGATIVE mg/dL
SPECIFIC GRAVITY, URINE: 1.02 (ref 1.005–1.030)
UROBILINOGEN UA: 0.2 mg/dL (ref 0.0–1.0)
pH: 5.5 (ref 5.0–8.0)

## 2015-09-23 NOTE — Patient Instructions (Signed)

## 2015-09-23 NOTE — Telephone Encounter (Signed)
Called patient and spoke to her regarding her referral to [facility]. The office requested that she call them and speak with Dawn in the business office to verify information. Phone number to [facility] given. Patient voiced understanding.
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
## 2015-09-24 LAB — CYTOLOGY - PAP

## 2015-09-28 ENCOUNTER — Encounter: Payer: Self-pay | Admitting: *Deleted

## 2016-01-27 ENCOUNTER — Emergency Department (HOSPITAL_COMMUNITY)
Admission: EM | Admit: 2016-01-27 | Discharge: 2016-01-27 | Disposition: A | Discharge status: Home / Self Care | Payer: Medicare Other | Attending: Emergency Medicine | Admitting: Emergency Medicine

## 2016-01-27 DIAGNOSIS — Z9851 Tubal ligation status: Secondary | ICD-10-CM | POA: Diagnosis not present

## 2016-01-27 DIAGNOSIS — Z8719 Personal history of other diseases of the digestive system: Secondary | ICD-10-CM | POA: Insufficient documentation

## 2016-01-27 DIAGNOSIS — Z8619 Personal history of other infectious and parasitic diseases: Secondary | ICD-10-CM | POA: Diagnosis not present

## 2016-01-27 DIAGNOSIS — Z79899 Other long term (current) drug therapy: Secondary | ICD-10-CM | POA: Insufficient documentation

## 2016-01-27 DIAGNOSIS — R1030 Lower abdominal pain, unspecified: Secondary | ICD-10-CM | POA: Diagnosis present

## 2016-01-27 DIAGNOSIS — Z9049 Acquired absence of other specified parts of digestive tract: Secondary | ICD-10-CM | POA: Insufficient documentation

## 2016-01-27 DIAGNOSIS — F17200 Nicotine dependence, unspecified, uncomplicated: Secondary | ICD-10-CM | POA: Insufficient documentation

## 2016-01-27 DIAGNOSIS — N390 Urinary tract infection, site not specified: Secondary | ICD-10-CM | POA: Insufficient documentation

## 2016-01-27 DIAGNOSIS — Z3202 Encounter for pregnancy test, result negative: Secondary | ICD-10-CM | POA: Diagnosis not present

## 2016-01-27 LAB — WET PREP, GENITAL
Clue Cells Wet Prep HPF POC: NONE SEEN
SPERM: NONE SEEN
Trich, Wet Prep: NONE SEEN
YEAST WET PREP: NONE SEEN

## 2016-01-27 LAB — URINALYSIS, ROUTINE W REFLEX MICROSCOPIC
Bilirubin Urine: NEGATIVE
Glucose, UA: NEGATIVE mg/dL
Ketones, ur: NEGATIVE mg/dL
Nitrite: POSITIVE — AB
Protein, ur: NEGATIVE mg/dL
SPECIFIC GRAVITY, URINE: 1.014 (ref 1.005–1.030)
pH: 6.5 (ref 5.0–8.0)

## 2016-01-27 LAB — URINE MICROSCOPIC-ADD ON

## 2016-01-27 LAB — I-STAT BETA HCG BLOOD, ED (MC, WL, AP ONLY)

## 2016-01-27 MED ORDER — PHENAZOPYRIDINE HCL 200 MG PO TABS: 200.0000 mg | ORAL_TABLET | Freq: Three times a day (TID) | ORAL | Status: DC | PRN

## 2016-01-27 MED ORDER — NITROFURANTOIN MONOHYD MACRO 100 MG PO CAPS: 100.0000 mg | ORAL_CAPSULE | Freq: Two times a day (BID) | ORAL | Status: DC

## 2016-01-27 NOTE — ED Notes (Signed)
Pt c/o vaginal burning, radiates into legs during ambulation, worse with urination and when washing self x 1 week. Pt diagnosed with herpes 2004. Denies any discharge. Reports having extra menstrual period this month.

## 2016-01-27 NOTE — ED Notes (Signed)
Pt reports vaginal burning x 1 week.  States worse when she's walking to the point that it makes her fall.  Pt reports hx of epilepsy.  Pt reports she does not have a GYN.  Pt denies any vaginal discharge at this time.

## 2016-01-27 NOTE — Discharge Instructions (Signed)
You have been seen today for your complaint of pain with urination. Your lab work showed urine infection. Your discharge medications include 1) macrobid Please take all of your antibiotics until finished!   You may develop abdominal discomfort or diarrhea from the antibiotic.  You may help offset this with probiotics which you can buy or get in yogurt. Do not eat  or take the probiotics until 2 hours after your antibiotic.  2) Pyridium This medication will help relieve pain and burning but does not treat the infection.  Make sure that you wear a panty liner as it may stain your underwear. Home care instructions are as follows:  1) please drink plenty of water, avoid tea and beverages with caffeine like coffee or soda 2) if you are sexually active, ,make sure to urinate immediately after intercourse Follow up with: your doctor or the emergency department Please seek immediate medical care if you develop any of the following symptoms: SEEK MEDICAL CARE IF:  You have back pain.  You develop a fever.  Your symptoms do not begin to resolve within 3 days.  SEEK IMMEDIATE MEDICAL CARE IF:  You have severe back pain or lower abdominal pain.  You develop chills.  You have nausea or vomiting.  You have continued burning or discomfort with urination.
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
## 2016-01-27 NOTE — ED Notes (Signed)
Pt ambulatory to the BR with steady gait

## 2016-01-28 LAB — GC/CHLAMYDIA PROBE AMP (~~LOC~~) NOT AT ARMC
Chlamydia: NEGATIVE
NEISSERIA GONORRHEA: NEGATIVE

## 2016-01-28 LAB — HIV ANTIBODY (ROUTINE TESTING W REFLEX): HIV SCREEN 4TH GENERATION: NONREACTIVE

## 2016-01-28 LAB — RPR: RPR Ser Ql: NONREACTIVE

## 2016-01-29 LAB — URINE CULTURE
Culture: >=100000
Special Requests: NORMAL

## 2016-01-31 ENCOUNTER — Telehealth (HOSPITAL_COMMUNITY): Payer: Self-pay

## 2016-01-31 NOTE — Telephone Encounter (Signed)
Post ED Visit - Positive Culture Follow-up  Culture report reviewed by antimicrobial stewardship pharmacist:  []  Elenor Quinones, Pharm.D. []  Heide Guile, Pharm.D., BCPS []  Parks Neptune, Pharm.D. []  Alycia Rossetti, Pharm.D., BCPS []  [place], Pharm.D., BCPS, AAHIVP []  Legrand Como, Pharm.D., BCPS, AAHIVP []  Milus Glazier, Pharm.D. []  Stephens November, Pharm.D. Ulyess Mort Rumbarger, Pharm.D.  Positive urine culture, >/= 100,000 colonies -> E Coli Treated with Nitrofurantoin, organism sensitive to the same and no further patient follow-up is required at this time.  Dortha Kern 01/31/2016, 5:48 AM

## 2016-06-30 ENCOUNTER — Encounter (HOSPITAL_COMMUNITY): Payer: Self-pay | Admitting: Emergency Medicine

## 2016-06-30 ENCOUNTER — Emergency Department (HOSPITAL_COMMUNITY)
Admission: EM | Admit: 2016-06-30 | Discharge: 2016-06-30 | Disposition: A | Discharge status: Home / Self Care | Payer: Medicare Other | Attending: Emergency Medicine | Admitting: Emergency Medicine

## 2016-06-30 DIAGNOSIS — I10 Essential (primary) hypertension: Secondary | ICD-10-CM | POA: Diagnosis not present

## 2016-06-30 DIAGNOSIS — Z79899 Other long term (current) drug therapy: Secondary | ICD-10-CM | POA: Insufficient documentation

## 2016-06-30 DIAGNOSIS — F17200 Nicotine dependence, unspecified, uncomplicated: Secondary | ICD-10-CM | POA: Diagnosis not present

## 2016-06-30 DIAGNOSIS — R21 Rash and other nonspecific skin eruption: Secondary | ICD-10-CM

## 2016-06-30 DIAGNOSIS — F1290 Cannabis use, unspecified, uncomplicated: Secondary | ICD-10-CM | POA: Insufficient documentation

## 2016-06-30 MED ORDER — DIPHENHYDRAMINE HCL 25 MG PO TABS: 25.0000 mg | ORAL_TABLET | Freq: Four times a day (QID) | ORAL | 0 refills | Status: DC | PRN

## 2016-06-30 MED ORDER — TRIAMCINOLONE ACETONIDE 0.1 % EX CREA: 1.0000 "application " | TOPICAL_CREAM | Freq: Two times a day (BID) | CUTANEOUS | 0 refills | Status: DC
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
## 2016-06-30 NOTE — Discharge Instructions (Signed)
Your skin irritation is likely from bedbugs.  Please wash your belongings in hot water.  Apply steroid cream to affected area as needed for itch.  Take benadryl to help with the itchiness.  Return if you have any concerns.

## 2016-07-13 ENCOUNTER — Emergency Department (HOSPITAL_COMMUNITY)
Admission: EM | Admit: 2016-07-13 | Discharge: 2016-07-13 | Discharge status: Against Medical Advice | Payer: Medicare Other

## 2016-07-13 NOTE — ED Notes (Signed)
Pt states that she is not waiting here to be seen / states "I am going to go to [facility] [place]

## 2016-07-19 ENCOUNTER — Encounter (HOSPITAL_COMMUNITY): Payer: Self-pay | Admitting: Emergency Medicine

## 2016-07-19 ENCOUNTER — Inpatient Hospital Stay (HOSPITAL_COMMUNITY)
Admission: EM | Admit: 2016-07-19 | Discharge: 2016-07-22 | DRG: 812 | Disposition: A | Discharge status: Home / Self Care | Payer: Medicare Other | Attending: Internal Medicine | Admitting: Internal Medicine

## 2016-07-19 ENCOUNTER — Emergency Department (HOSPITAL_COMMUNITY): Payer: Medicare Other

## 2016-07-19 DIAGNOSIS — D509 Iron deficiency anemia, unspecified: Secondary | ICD-10-CM | POA: Diagnosis not present

## 2016-07-19 DIAGNOSIS — F17210 Nicotine dependence, cigarettes, uncomplicated: Secondary | ICD-10-CM | POA: Diagnosis present

## 2016-07-19 DIAGNOSIS — Z9049 Acquired absence of other specified parts of digestive tract: Secondary | ICD-10-CM

## 2016-07-19 DIAGNOSIS — R569 Unspecified convulsions: Secondary | ICD-10-CM

## 2016-07-19 DIAGNOSIS — Z8249 Family history of ischemic heart disease and other diseases of the circulatory system: Secondary | ICD-10-CM

## 2016-07-19 DIAGNOSIS — K2980 Duodenitis without bleeding: Secondary | ICD-10-CM | POA: Diagnosis present

## 2016-07-19 DIAGNOSIS — D649 Anemia, unspecified: Secondary | ICD-10-CM | POA: Diagnosis not present

## 2016-07-19 DIAGNOSIS — Z79899 Other long term (current) drug therapy: Secondary | ICD-10-CM

## 2016-07-19 DIAGNOSIS — B9620 Unspecified Escherichia coli [E. coli] as the cause of diseases classified elsewhere: Secondary | ICD-10-CM | POA: Diagnosis present

## 2016-07-19 DIAGNOSIS — K2970 Gastritis, unspecified, without bleeding: Secondary | ICD-10-CM | POA: Diagnosis present

## 2016-07-19 DIAGNOSIS — I10 Essential (primary) hypertension: Secondary | ICD-10-CM | POA: Diagnosis present

## 2016-07-19 DIAGNOSIS — N390 Urinary tract infection, site not specified: Secondary | ICD-10-CM | POA: Diagnosis present

## 2016-07-19 DIAGNOSIS — G40909 Epilepsy, unspecified, not intractable, without status epilepticus: Secondary | ICD-10-CM | POA: Diagnosis present

## 2016-07-19 LAB — BRAIN NATRIURETIC PEPTIDE: B Natriuretic Peptide: 53.7 pg/mL (ref 0.0–100.0)

## 2016-07-19 LAB — COMPREHENSIVE METABOLIC PANEL
ALBUMIN: 4 g/dL (ref 3.5–5.0)
ALK PHOS: 65 U/L (ref 38–126)
ALT: 64 U/L — ABNORMAL HIGH (ref 14–54)
ANION GAP: 7 (ref 5–15)
AST: 35 U/L (ref 15–41)
BILIRUBIN TOTAL: 0.4 mg/dL (ref 0.3–1.2)
BUN: 12 mg/dL (ref 6–20)
CALCIUM: 8.9 mg/dL (ref 8.9–10.3)
CO2: 21 mmol/L — ABNORMAL LOW (ref 22–32)
Chloride: 112 mmol/L — ABNORMAL HIGH (ref 101–111)
Creatinine, Ser: 0.63 mg/dL (ref 0.44–1.00)
GFR calc Af Amer: >60 mL/min (ref >60)
GLUCOSE: 86 mg/dL (ref 65–99)
POTASSIUM: 4.2 mmol/L (ref 3.5–5.1)
Sodium: 140 mmol/L (ref 135–145)
TOTAL PROTEIN: 6.7 g/dL (ref 6.5–8.1)

## 2016-07-19 LAB — URINE MICROSCOPIC-ADD ON

## 2016-07-19 LAB — I-STAT TROPONIN, ED: TROPONIN I, POC: 0 ng/mL (ref 0.00–0.08)

## 2016-07-19 LAB — IRON AND TIBC
Iron: 10 ug/dL — ABNORMAL LOW (ref 28–170)
SATURATION RATIOS: 2 % — AB (ref 10.4–31.8)
TIBC: 489 ug/dL — AB (ref 250–450)
UIBC: 479 ug/dL

## 2016-07-19 LAB — URINALYSIS, ROUTINE W REFLEX MICROSCOPIC
Bilirubin Urine: NEGATIVE
GLUCOSE, UA: NEGATIVE mg/dL
Hgb urine dipstick: NEGATIVE
KETONES UR: NEGATIVE mg/dL
LEUKOCYTES UA: NEGATIVE
NITRITE: POSITIVE — AB
PROTEIN: NEGATIVE mg/dL
Specific Gravity, Urine: 1.017 (ref 1.005–1.030)
pH: 5.5 (ref 5.0–8.0)

## 2016-07-19 LAB — ABO/RH: ABO/RH(D): O POS

## 2016-07-19 LAB — CBC
HCT: 21.8 % — ABNORMAL LOW (ref 36.0–46.0)
Hemoglobin: 6.2 g/dL — CL (ref 12.0–15.0)
MCH: 20 pg — ABNORMAL LOW (ref 26.0–34.0)
MCHC: 28.4 g/dL — AB (ref 30.0–36.0)
MCV: 70.3 fL — ABNORMAL LOW (ref 78.0–100.0)
Platelets: 241 10*3/uL (ref 150–400)
RBC: 3.1 MIL/uL — ABNORMAL LOW (ref 3.87–5.11)
RDW: 20.6 % — AB (ref 11.5–15.5)
WBC: 4.9 10*3/uL (ref 4.0–10.5)

## 2016-07-19 LAB — PREPARE RBC (CROSSMATCH)

## 2016-07-19 LAB — RETICULOCYTES
RBC.: 3.21 MIL/uL — AB (ref 3.87–5.11)
RETIC CT PCT: 1.6 % (ref 0.4–3.1)
Retic Count, Absolute: 51.4 10*3/uL (ref 19.0–186.0)

## 2016-07-19 LAB — FOLATE: Folate: 7.7 ng/mL (ref >5.9)

## 2016-07-19 LAB — VITAMIN B12: Vitamin B-12: 346 pg/mL (ref 180–914)

## 2016-07-19 LAB — SEDIMENTATION RATE: SED RATE: 4 mm/h (ref 0–22)

## 2016-07-19 LAB — FERRITIN: FERRITIN: 5 ng/mL — AB (ref 11–307)

## 2016-07-19 LAB — POC OCCULT BLOOD, ED: Fecal Occult Bld: NEGATIVE

## 2016-07-19 MED ORDER — LACOSAMIDE 50 MG PO TABS
100.0000 mg | ORAL_TABLET | Freq: Two times a day (BID) | ORAL | Status: DC
  Administered 2016-07-20 – 2016-07-22 (×6): 100 mg via ORAL
  Filled 2016-07-19 (×6): qty 2

## 2016-07-19 MED ORDER — IBUPROFEN 200 MG PO TABS: 400.0000 mg | ORAL_TABLET | Freq: Four times a day (QID) | ORAL | Status: DC | PRN

## 2016-07-19 MED ORDER — ACETAMINOPHEN 325 MG PO TABS
650.0000 mg | ORAL_TABLET | Freq: Once | ORAL | Status: AC
  Administered 2016-07-20: 650 mg via ORAL
  Filled 2016-07-19: qty 2

## 2016-07-19 MED ORDER — ALBUTEROL SULFATE HFA 108 (90 BASE) MCG/ACT IN AERS: 2.0000 | INHALATION_SPRAY | Freq: Four times a day (QID) | RESPIRATORY_TRACT | Status: DC | PRN

## 2016-07-19 MED ORDER — CARBAMAZEPINE ER 200 MG PO TB12
300.0000 mg | ORAL_TABLET | Freq: Two times a day (BID) | ORAL | Status: DC
  Administered 2016-07-20 – 2016-07-22 (×6): 300 mg via ORAL
  Filled 2016-07-19 (×7): qty 1

## 2016-07-19 MED ORDER — ACETAMINOPHEN 650 MG RE SUPP: 650.0000 mg | Freq: Four times a day (QID) | RECTAL | Status: DC | PRN

## 2016-07-19 MED ORDER — LEVETIRACETAM 500 MG PO TABS
1000.0000 mg | ORAL_TABLET | Freq: Two times a day (BID) | ORAL | Status: DC
  Filled 2016-07-19 (×3): qty 2

## 2016-07-19 MED ORDER — LORAZEPAM 1 MG PO TABS
1.0000 mg | ORAL_TABLET | ORAL | Status: DC | PRN
Start: 2016-07-19 — End: 2016-07-22

## 2016-07-19 MED ORDER — NITROFURANTOIN MONOHYD MACRO 100 MG PO CAPS
100.0000 mg | ORAL_CAPSULE | Freq: Once | ORAL | Status: AC
  Administered 2016-07-20: 100 mg via ORAL
  Filled 2016-07-19: qty 1

## 2016-07-19 MED ORDER — SODIUM CHLORIDE 0.9 % IV SOLN
10.0000 mL/h | Freq: Once | INTRAVENOUS | Status: AC
  Administered 2016-07-19: 10 mL/h via INTRAVENOUS

## 2016-07-19 MED ORDER — SODIUM CHLORIDE 0.9% FLUSH
3.0000 mL | Freq: Two times a day (BID) | INTRAVENOUS | Status: DC
  Administered 2016-07-20 – 2016-07-21 (×2): 3 mL via INTRAVENOUS

## 2016-07-19 MED ORDER — ACETAMINOPHEN 325 MG PO TABS: 650.0000 mg | ORAL_TABLET | Freq: Four times a day (QID) | ORAL | Status: DC | PRN

## 2016-07-19 MED ORDER — ALBUTEROL SULFATE (2.5 MG/3ML) 0.083% IN NEBU
2.5000 mg | INHALATION_SOLUTION | Freq: Four times a day (QID) | RESPIRATORY_TRACT | Status: DC | PRN
Start: 2016-07-19 — End: 2016-07-22

## 2016-07-19 MED ORDER — FLUOXETINE HCL 10 MG PO CAPS
10.0000 mg | ORAL_CAPSULE | Freq: Every day | ORAL | Status: DC
  Administered 2016-07-20 – 2016-07-21 (×3): 10 mg via ORAL
  Filled 2016-07-19 (×3): qty 1

## 2016-07-19 MED ORDER — SODIUM CHLORIDE 0.9 % IV SOLN
Freq: Once | INTRAVENOUS | Status: AC
  Administered 2016-07-20: 10 mL/h via INTRAVENOUS

## 2016-07-19 MED ORDER — FLUOXETINE HCL 20 MG PO CAPS
20.0000 mg | ORAL_CAPSULE | Freq: Every day | ORAL | Status: DC
  Administered 2016-07-20 – 2016-07-22 (×3): 20 mg via ORAL
  Filled 2016-07-19 (×3): qty 1

## 2016-07-19 MED ORDER — DIPHENHYDRAMINE HCL 25 MG PO CAPS
25.0000 mg | ORAL_CAPSULE | Freq: Once | ORAL | Status: AC
  Administered 2016-07-20: 25 mg via ORAL
  Filled 2016-07-19: qty 1

## 2016-07-19 NOTE — ED Provider Notes (Signed)
Complains of bilateral leg swelling for the past 5 days as well as feeling "tired" for the past 5 days. Patient is alert awake and in no distress. Noted to be markedly anemic. Hospitalist service consulted. Plan 23 hour observation overnight stay, transfusion   Orlie Dakin, MD 07/19/16 2036

## 2016-07-19 NOTE — ED Provider Notes (Signed)
7:14 PM I have seen and examined pt. Pt with LE edema and left arm pain for several days. Was seen at [facility] yesterday, she left without being seen after a long wait. Was called back and told hgb is 6. Pt denies any complaints at this time. Arm pain resolved. Hgb 6.2 here. Blood ordered. Work up pending.   Pt admitted to [facility] hospitalist for further work up of her anemia. VS normal.      Jeannett Senior, PA-C 07/20/16 0159    Orlie Dakin, MD 07/20/16 1544

## 2016-07-19 NOTE — ED Notes (Signed)
Patient transported to X-ray 
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
## 2016-07-19 NOTE — ED Notes (Signed)
Pt is currently receiving 1st unit of blood and is to only receive 1 additional unit of blood per provider request. Blood bank informed of order and charge nurse notified of blood order change. Next nurse given report and told only 2 total units blood wanted not 3 units total. ONLY A TOTAL OF 2 UNITS TO BE GIVEN NOT ANYMORE.
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
## 2016-07-19 NOTE — ED Triage Notes (Signed)
Patient presents for bilateral ankle swelling and urinary frequency x4 days. Sent by PCP because of low HBG (6). Denies lightheadedness, dizziness or weakness. A&O x4.

## 2016-07-20 DIAGNOSIS — D649 Anemia, unspecified: Secondary | ICD-10-CM | POA: Diagnosis not present

## 2016-07-20 LAB — COMPREHENSIVE METABOLIC PANEL
ALBUMIN: 3.7 g/dL (ref 3.5–5.0)
ALK PHOS: 63 U/L (ref 38–126)
ALT: 63 U/L — ABNORMAL HIGH (ref 14–54)
ANION GAP: 6 (ref 5–15)
AST: 44 U/L — ABNORMAL HIGH (ref 15–41)
BUN: 9 mg/dL (ref 6–20)
CALCIUM: 8.7 mg/dL — AB (ref 8.9–10.3)
CHLORIDE: 110 mmol/L (ref 101–111)
CO2: 22 mmol/L (ref 22–32)
Creatinine, Ser: 0.6 mg/dL (ref 0.44–1.00)
GFR calc non Af Amer: >60 mL/min (ref >60)
GLUCOSE: 159 mg/dL — AB (ref 65–99)
POTASSIUM: 3.4 mmol/L — AB (ref 3.5–5.1)
SODIUM: 138 mmol/L (ref 135–145)
Total Bilirubin: 0.7 mg/dL (ref 0.3–1.2)
Total Protein: 6.1 g/dL — ABNORMAL LOW (ref 6.5–8.1)

## 2016-07-20 LAB — CBC
HEMATOCRIT: 26.8 % — AB (ref 36.0–46.0)
HEMOGLOBIN: 8.2 g/dL — AB (ref 12.0–15.0)
MCH: 22.7 pg — AB (ref 26.0–34.0)
MCHC: 30.6 g/dL (ref 30.0–36.0)
MCV: 74 fL — AB (ref 78.0–100.0)
Platelets: 240 10*3/uL (ref 150–400)
RBC: 3.62 MIL/uL — AB (ref 3.87–5.11)
RDW: 21.3 % — ABNORMAL HIGH (ref 11.5–15.5)
WBC: 5.1 10*3/uL (ref 4.0–10.5)

## 2016-07-20 MED ORDER — SENNOSIDES-DOCUSATE SODIUM 8.6-50 MG PO TABS
2.0000 | ORAL_TABLET | Freq: Every day | ORAL | Status: DC
  Filled 2016-07-20 (×2): qty 2

## 2016-07-20 MED ORDER — SODIUM CHLORIDE 0.9 % IV SOLN
510.0000 mg | Freq: Once | INTRAVENOUS | Status: AC
  Administered 2016-07-20: 510 mg via INTRAVENOUS
  Filled 2016-07-20: qty 17

## 2016-07-20 MED ORDER — POTASSIUM CHLORIDE CRYS ER 20 MEQ PO TBCR
40.0000 meq | EXTENDED_RELEASE_TABLET | Freq: Once | ORAL | Status: AC
  Administered 2016-07-20: 40 meq via ORAL
  Filled 2016-07-20: qty 2

## 2016-07-20 MED ORDER — PEG 3350-KCL-NA BICARB-NACL 420 G PO SOLR
4000.0000 mL | Freq: Once | ORAL | Status: AC
  Administered 2016-07-20: 4000 mL via ORAL

## 2016-07-20 MED ORDER — TRAMADOL HCL 50 MG PO TABS
50.0000 mg | ORAL_TABLET | Freq: Four times a day (QID) | ORAL | Status: DC | PRN
  Administered 2016-07-21: 50 mg via ORAL
  Filled 2016-07-20: qty 1

## 2016-07-20 MED ORDER — SODIUM CHLORIDE 0.9 % IV SOLN
INTRAVENOUS | Status: DC
  Administered 2016-07-20: 18:00:00 via INTRAVENOUS

## 2016-07-20 NOTE — Progress Notes (Signed)
Pt found on home CPAP machine.  Pt tolerating well at this time.  RT inspected machine for damages and none were present.  Pt's machine is working properly at this time.  RT to monitor and assess as needed.

## 2016-07-20 NOTE — Progress Notes (Signed)
PROGRESS NOTE    Veronica Francis  [id number] DOB: Sep 14, 1976 DOA: 07/19/2016 PCP: Philis Fendt, MD (Confirm with patient/family/NH records and if not entered, this HAS to be entered at [facility] point of entry. "No PCP" if truly none.)   Brief Narrative: Veronica Francis  is a 39 y.o. female, w/ hx of anemia, apparently c/o slight leg swelling for the past 5 days and feeling tired.  Apparently seen at [facility] but left before her labs came back, and then today was told that she was anemic.  She went to see her pcp and was sent to ER for low Hgb.  Pt denies brbpr, black stool, hematuria, vaginal bleeding.    In ED, Hgb 6.2,  Pt will be admitted for transfusion.     Assessment & Plan:   Active Problems:   Anemia  1-Anemia, iron deficiency;  Report constipation, BM maybe once a week. unknown family history for malignancy.  Occult blood negative times one in the ed.  Denies heavy menstrual periods.  Hb improved to 8 after 2 units.  Iron at 10.  Will discussed with GI need for colonoscopy.  Will give IV iron./   2-Seizure; continue with home medications.    DVT prophylaxis: SCD Code Status: Full code.  Family Communication: husband Disposition Plan: home in 24 hours.    Consultants:   GI    Procedures:   none   Antimicrobials:  Nitrofurantoin    Subjective: Denies melena, hematemesis. Report her menstrual periods days and flow has been decreasing since the beginning of the year.   Objective: Vitals:   07/20/16 0031 07/20/16 0245 07/20/16 0315 07/20/16 0645  BP: 93/69 127/69 128/73 132/67  Pulse: 64 61 61 63  Resp: 19 16 16 16   Temp: 98.4 F (36.9 C) 98.4 F (36.9 C) 98.4 F (36.9 C) 98.1 F (36.7 C)  TempSrc: Oral Oral Oral Oral  SpO2: 100% 100% 100% 100%    Intake/Output Summary (Last 24 hours) at 07/20/16 0925 Last data filed at 07/20/16 0645  Gross per 24 hour  Intake              310 ml  Output                0 ml  Net              310 ml    There were no vitals filed for this visit.  Examination:  General exam: Appears calm and comfortable  Respiratory system: Clear to auscultation. Respiratory effort normal. Cardiovascular system: S1 & S2 heard, RRR. No JVD, murmurs, rubs, gallops or clicks. No pedal edema. Gastrointestinal system: Abdomen is nondistended, soft and nontender. No organomegaly or masses felt. Normal bowel sounds heard. Central nervous system: Alert and oriented. No focal neurological deficits. Extremities: Symmetric 5 x 5 power. Skin: No rashes, lesions or ulcers Psychiatry: Judgement and insight appear normal. Mood & affect appropriate.     Data Reviewed: I have personally reviewed following labs and imaging studies  CBC:  Recent Labs Lab 07/19/16 1732 07/20/16 0842  WBC 4.9 5.1  HGB 6.2* 8.2*  HCT 21.8* 26.8*  MCV 70.3* 74.0*  PLT 241 [id number]   Basic Metabolic Panel:  Recent Labs Lab 07/19/16 1732  NA 140  K 4.2  CL 112*  CO2 21*  GLUCOSE 86  BUN 12  CREATININE 0.63  CALCIUM 8.9   GFR: CrCl cannot be calculated (Unknown ideal weight.). Liver Function Tests:  Recent Labs Lab 07/19/16 1732  AST 35  ALT 64*  ALKPHOS 65  BILITOT 0.4  PROT 6.7  ALBUMIN 4.0   No results for input(s): LIPASE, AMYLASE in the last 168 hours. No results for input(s): AMMONIA in the last 168 hours. Coagulation Profile: No results for input(s): INR, PROTIME in the last 168 hours. Cardiac Enzymes: No results for input(s): CKTOTAL, CKMB, CKMBINDEX, TROPONINI in the last 168 hours. BNP (last 3 results) No results for input(s): PROBNP in the last 8760 hours. HbA1C: No results for input(s): HGBA1C in the last 72 hours. CBG: No results for input(s): GLUCAP in the last 168 hours. Lipid Profile: No results for input(s): CHOL, HDL, LDLCALC, TRIG, CHOLHDL, LDLDIRECT in the last 72 hours. Thyroid Function Tests: No results for input(s): TSH, T4TOTAL, FREET4, T3FREE, THYROIDAB in the last 72  hours. Anemia Panel:  Recent Labs  07/19/16 1820  VITAMINB12 346  FOLATE 7.7  FERRITIN 5*  TIBC 489*  IRON 10*  RETICCTPCT 1.6   Sepsis Labs: No results for input(s): PROCALCITON, LATICACIDVEN in the last 168 hours.  No results found for this or any previous visit (from the past 240 hour(s)).       Radiology Studies: Dg Chest 2 View  Result Date: 07/19/2016 CLINICAL DATA:  Arm pain. Lower extremity swelling and increased urinary frequency over the last 4 days. EXAM: CHEST  2 VIEW COMPARISON:  One-view chest x-ray 10/14/2012 FINDINGS: Heart size normal. Lungs are clear. A stimulator device projects over the left chest. The visualized soft tissues and bony thorax are otherwise unremarkable. IMPRESSION: No active cardiopulmonary disease. Electronically Signed   By: San Morelle M.D.   On: 07/19/2016 19:14        Scheduled Meds: . carbamazepine  300 mg Oral BID  . FLUoxetine  10 mg Oral QHS  . FLUoxetine  20 mg Oral Daily  . lacosamide  100 mg Oral BID  . levETIRAcetam  1,000 mg Oral BID  . sodium chloride flush  3 mL Intravenous Q12H   Continuous Infusions:    LOS: 0 days    Time spent: 35 minutes.     Elmarie Shiley, MD [facility] Pager [phone]  If 7PM-7AM, please contact night-coverage [email or web address] Password [email or web address] 07/20/2016, 9:25 AM

## 2016-07-20 NOTE — Care Management Obs Status (Signed)
[place] NOTIFICATION   Patient Details  Name: Veronica Francis MRN: [id number] Date of Birth: 21-May-1976   Medicare Observation Status Notification Given:  Yes    Guadalupe Maple, RN 07/20/2016, 3:46 PM
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
## 2016-07-21 ENCOUNTER — Encounter (HOSPITAL_COMMUNITY): Payer: Self-pay | Admitting: *Deleted

## 2016-07-21 ENCOUNTER — Encounter (HOSPITAL_COMMUNITY)
Admission: EM | Disposition: A | Discharge status: Home / Self Care | Payer: Self-pay | Source: Home / Self Care | Attending: Internal Medicine

## 2016-07-21 DIAGNOSIS — B9620 Unspecified Escherichia coli [E. coli] as the cause of diseases classified elsewhere: Secondary | ICD-10-CM | POA: Diagnosis present

## 2016-07-21 DIAGNOSIS — D649 Anemia, unspecified: Secondary | ICD-10-CM | POA: Diagnosis present

## 2016-07-21 DIAGNOSIS — D509 Iron deficiency anemia, unspecified: Secondary | ICD-10-CM | POA: Diagnosis present

## 2016-07-21 DIAGNOSIS — Z79899 Other long term (current) drug therapy: Secondary | ICD-10-CM | POA: Diagnosis not present

## 2016-07-21 DIAGNOSIS — I10 Essential (primary) hypertension: Secondary | ICD-10-CM | POA: Diagnosis present

## 2016-07-21 DIAGNOSIS — G40909 Epilepsy, unspecified, not intractable, without status epilepticus: Secondary | ICD-10-CM | POA: Diagnosis present

## 2016-07-21 DIAGNOSIS — K2970 Gastritis, unspecified, without bleeding: Secondary | ICD-10-CM | POA: Diagnosis present

## 2016-07-21 DIAGNOSIS — F17210 Nicotine dependence, cigarettes, uncomplicated: Secondary | ICD-10-CM | POA: Diagnosis present

## 2016-07-21 DIAGNOSIS — K2980 Duodenitis without bleeding: Secondary | ICD-10-CM | POA: Diagnosis present

## 2016-07-21 DIAGNOSIS — Z8249 Family history of ischemic heart disease and other diseases of the circulatory system: Secondary | ICD-10-CM | POA: Diagnosis not present

## 2016-07-21 DIAGNOSIS — N390 Urinary tract infection, site not specified: Secondary | ICD-10-CM | POA: Diagnosis present

## 2016-07-21 DIAGNOSIS — Z9049 Acquired absence of other specified parts of digestive tract: Secondary | ICD-10-CM | POA: Diagnosis not present

## 2016-07-21 HISTORY — PX: ESOPHAGOGASTRODUODENOSCOPY: SHX5428

## 2016-07-21 HISTORY — PX: COLONOSCOPY: SHX5424

## 2016-07-21 LAB — BASIC METABOLIC PANEL
Anion gap: 7 (ref 5–15)
BUN: <5 mg/dL — ABNORMAL LOW (ref 6–20)
CHLORIDE: 117 mmol/L — AB (ref 101–111)
CO2: 18 mmol/L — ABNORMAL LOW (ref 22–32)
Calcium: 9.2 mg/dL (ref 8.9–10.3)
Creatinine, Ser: 0.57 mg/dL (ref 0.44–1.00)
GFR calc Af Amer: >60 mL/min (ref >60)
Glucose, Bld: 76 mg/dL (ref 65–99)
POTASSIUM: 4.5 mmol/L (ref 3.5–5.1)
SODIUM: 142 mmol/L (ref 135–145)

## 2016-07-21 LAB — CBC
HCT: 27.2 % — ABNORMAL LOW (ref 36.0–46.0)
HEMOGLOBIN: 8.3 g/dL — AB (ref 12.0–15.0)
MCH: 22.4 pg — AB (ref 26.0–34.0)
MCHC: 30.5 g/dL (ref 30.0–36.0)
MCV: 73.5 fL — ABNORMAL LOW (ref 78.0–100.0)
PLATELETS: 252 10*3/uL (ref 150–400)
RBC: 3.7 MIL/uL — AB (ref 3.87–5.11)
RDW: 21.7 % — ABNORMAL HIGH (ref 11.5–15.5)
WBC: 4.6 10*3/uL (ref 4.0–10.5)

## 2016-07-21 LAB — TYPE AND SCREEN
ABO/RH(D): O POS
Antibody Screen: NEGATIVE
UNIT DIVISION: 0
Unit division: 0

## 2016-07-21 SURGERY — EGD (ESOPHAGOGASTRODUODENOSCOPY)
Anesthesia: Moderate Sedation

## 2016-07-21 MED ORDER — FENTANYL CITRATE (PF) 100 MCG/2ML IJ SOLN
INTRAMUSCULAR | Status: DC | PRN
  Administered 2016-07-21 (×3): 25 ug via INTRAVENOUS

## 2016-07-21 MED ORDER — DIPHENHYDRAMINE HCL 50 MG/ML IJ SOLN
INTRAMUSCULAR | Status: AC
  Filled 2016-07-21: qty 1

## 2016-07-21 MED ORDER — DEXTROSE 5 % IV SOLN
1.0000 g | INTRAVENOUS | Status: DC
  Filled 2016-07-21: qty 10

## 2016-07-21 MED ORDER — FENTANYL CITRATE (PF) 100 MCG/2ML IJ SOLN
INTRAMUSCULAR | Status: AC
  Filled 2016-07-21: qty 4

## 2016-07-21 MED ORDER — MIDAZOLAM HCL 5 MG/ML IJ SOLN
INTRAMUSCULAR | Status: AC
  Filled 2016-07-21: qty 2

## 2016-07-21 MED ORDER — MIDAZOLAM HCL 5 MG/5ML IJ SOLN
INTRAMUSCULAR | Status: DC | PRN
  Administered 2016-07-21 (×2): 2 mg via INTRAVENOUS
  Administered 2016-07-21: 1 mg via INTRAVENOUS

## 2016-07-21 MED ORDER — NICOTINE 21 MG/24HR TD PT24
21.0000 mg | MEDICATED_PATCH | Freq: Every day | TRANSDERMAL | Status: DC
Start: 2016-07-21 — End: 2016-07-22
  Administered 2016-07-21 – 2016-07-22 (×2): 21 mg via TRANSDERMAL
  Filled 2016-07-21 (×2): qty 1

## 2016-07-21 NOTE — Progress Notes (Addendum)
PROGRESS NOTE    Veronica Francis  [id number] DOB: 11/16/1976 DOA: 07/19/2016 PCP: Philis Fendt, MD  Brief Narrative: Veronica Francis  is a 39 y.o. female, w/ hx of anemia, apparently c/o slight leg swelling for the past 5 days and feeling tired.  Apparently seen at [facility] but left before her labs came back, and then today was told that she was anemic.  She went to see her pcp and was sent to ER for low Hgb.  Pt denies brbpr, black stool, hematuria, vaginal bleeding.    In ED, Hgb 6.2,  Pt will be admitted for transfusion.     Assessment & Plan:   Active Problems:   Anemia  1-Anemia, iron deficiency;  Report constipation, BM maybe once a week. unknown family history for malignancy.  Occult blood negative times one in the ed.  Denies heavy menstrual periods.  Hb improved to 8 after 2 units.  Iron at 10.  Appreciate Dr hung help, plan for endoscopy, colonoscopy today  Received IV iron./   2-Seizure; continue with home medications.  Patient relates that she has not been taking Keppra for long period of time. She was not tolerating keppra.   3-UTI; urine growing more than 100,000 e coli. Ceftriaxone. Follow sensitivity    DVT prophylaxis: SCD Code Status: Full code.  Family Communication: husband Disposition Plan: home in 24 hours.    Consultants:   GI    Procedures:   none   Antimicrobials:  Nitrofurantoin    Subjective: No new complaints.  Relates she doesn't take Keppra  Objective: Vitals:   07/20/16 0645 07/20/16 1357 07/20/16 2118 07/21/16 0503  BP: 132/67 128/77 134/82 113/89  Pulse: 63 71 64 83  Resp: 16 18 18 18   Temp: 98.1 F (36.7 C) 99.4 F (37.4 C) 98.1 F (36.7 C) 98.5 F (36.9 C)  TempSrc: Oral Oral Oral Oral  SpO2: 100% 100% 100% 95%    Intake/Output Summary (Last 24 hours) at 07/21/16 1437 Last data filed at 07/21/16 1226  Gross per 24 hour  Intake            12.33 ml  Output                0 ml  Net            12.33  ml   There were no vitals filed for this visit.  Examination:  General exam: Appears calm and comfortable  Respiratory system: Clear to auscultation. Respiratory effort normal. Cardiovascular system: S1 & S2 heard, RRR. No JVD, murmurs, rubs, gallops or clicks. No pedal edema. Gastrointestinal system: Abdomen is nondistended, soft and nontender. No organomegaly or masses felt. Normal bowel sounds heard. Central nervous system: Alert and oriented. No focal neurological deficits. Extremities: Symmetric 5 x 5 power. Skin: No rashes, lesions or ulcers Psychiatry: Judgement and insight appear normal. Mood & affect appropriate.     Data Reviewed: I have personally reviewed following labs and imaging studies  CBC:  Recent Labs Lab 07/19/16 1732 07/20/16 0842 07/21/16 0826  WBC 4.9 5.1 4.6  HGB 6.2* 8.2* 8.3*  HCT 21.8* 26.8* 27.2*  MCV 70.3* 74.0* 73.5*  PLT 241 240 [id number]   Basic Metabolic Panel:  Recent Labs Lab 07/19/16 1732 07/20/16 0842 07/21/16 0826  NA 140 138 142  K 4.2 3.4* 4.5  CL 112* 110 117*  CO2 21* 22 18*  GLUCOSE 86 159* 76  BUN 12 9 <5*  CREATININE 0.63 0.60 0.57  CALCIUM  8.9 8.7* 9.2   GFR: CrCl cannot be calculated (Unknown ideal weight.). Liver Function Tests:  Recent Labs Lab 07/19/16 1732 07/20/16 0842  AST 35 44*  ALT 64* 63*  ALKPHOS 65 63  BILITOT 0.4 0.7  PROT 6.7 6.1*  ALBUMIN 4.0 3.7   No results for input(s): LIPASE, AMYLASE in the last 168 hours. No results for input(s): AMMONIA in the last 168 hours. Coagulation Profile: No results for input(s): INR, PROTIME in the last 168 hours. Cardiac Enzymes: No results for input(s): CKTOTAL, CKMB, CKMBINDEX, TROPONINI in the last 168 hours. BNP (last 3 results) No results for input(s): PROBNP in the last 8760 hours. HbA1C: No results for input(s): HGBA1C in the last 72 hours. CBG: No results for input(s): GLUCAP in the last 168 hours. Lipid Profile: No results for input(s): CHOL,  HDL, LDLCALC, TRIG, CHOLHDL, LDLDIRECT in the last 72 hours. Thyroid Function Tests: No results for input(s): TSH, T4TOTAL, FREET4, T3FREE, THYROIDAB in the last 72 hours. Anemia Panel:  Recent Labs  07/19/16 1820  VITAMINB12 346  FOLATE 7.7  FERRITIN 5*  TIBC 489*  IRON 10*  RETICCTPCT 1.6   Sepsis Labs: No results for input(s): PROCALCITON, LATICACIDVEN in the last 168 hours.  Recent Results (from the past 240 hour(s))  Urine culture     Status: Abnormal (Preliminary result)   Collection Time: 07/19/16  6:51 PM  Result Value Ref Range Status   Specimen Description URINE, CLEAN CATCH  Final   Special Requests NONE  Final   Culture >=100,000 COLONIES/mL ESCHERICHIA COLI (A)  Final   Report Status PENDING  Incomplete         Radiology Studies: Dg Chest 2 View  Result Date: 07/19/2016 CLINICAL DATA:  Arm pain. Lower extremity swelling and increased urinary frequency over the last 4 days. EXAM: CHEST  2 VIEW COMPARISON:  One-view chest x-ray 10/14/2012 FINDINGS: Heart size normal. Lungs are clear. A stimulator device projects over the left chest. The visualized soft tissues and bony thorax are otherwise unremarkable. IMPRESSION: No active cardiopulmonary disease. Electronically Signed   By: San Morelle M.D.   On: 07/19/2016 19:14        Scheduled Meds: . carbamazepine  300 mg Oral BID  . FLUoxetine  10 mg Oral QHS  . FLUoxetine  20 mg Oral Daily  . lacosamide  100 mg Oral BID  . nicotine  21 mg Transdermal Daily  . senna-docusate  2 tablet Oral QHS  . sodium chloride flush  3 mL Intravenous Q12H   Continuous Infusions: . sodium chloride 75 mL/hr at 07/21/16 1043     LOS: 0 days    Time spent: 35 minutes.     Veronica Shiley, MD [facility] Pager [phone]  If 7PM-7AM, please contact night-coverage [email or web address] Password [facility] 07/21/2016, 2:37 PM

## 2016-07-21 NOTE — Interval H&P Note (Signed)
History and Physical Interval Note:  07/21/2016 4:51 PM  Veronica Francis  has presented today for surgery, with the diagnosis of Iron deficiency anemia  The various methods of treatment have been discussed with the patient and family. After consideration of risks, benefits and other options for treatment, the patient has consented to  Procedure(s): ESOPHAGOGASTRODUODENOSCOPY (EGD) (N/A) COLONOSCOPY (N/A) as a surgical intervention .  The patient's history has been reviewed, patient examined, no change in status, stable for surgery.  I have reviewed the patient's chart and labs.  Questions were answered to the patient's satisfaction.     Daeron Carreno D

## 2016-07-21 NOTE — Op Note (Signed)
[facility] Patient Name: Veronica Francis Procedure Date: 07/21/2016 MRN: [id number] Attending MD: Carol Ada , MD Date of Birth: 1976/01/25 CSN: [id number] Age: 40 Admit Type: Inpatient Procedure:                Colonoscopy Indications:              Iron deficiency anemia Providers:                Carol Ada, MD, Carolynn Comment, RN, Corliss Parish, Technician Referring MD:              Medicines:                Midazolam 5 mg IV, Fentanyl 100 micrograms IV Complications:            No immediate complications. Estimated Blood Loss:     Estimated blood loss: none. Procedure:                Pre-Anesthesia Assessment:                           - Prior to the procedure, a History and Physical                            was performed, and patient medications and                            allergies were reviewed. The patient's tolerance of                            previous anesthesia was also reviewed. The risks                            and benefits of the procedure and the sedation                            options and risks were discussed with the patient.                            All questions were answered, and informed consent                            was obtained. Prior Anticoagulants: The patient has                            taken no previous anticoagulant or antiplatelet                            agents. ASA Grade Assessment: III - A patient with                            severe systemic disease. After reviewing the risks  and benefits, the patient was deemed in                            satisfactory condition to undergo the procedure.                           - Sedation was administered by an endoscopy nurse.                            The sedation level attained was moderate.                           After obtaining informed consent, the colonoscope                            was passed  under direct vision. Throughout the                            procedure, the patient's blood pressure, pulse, and                            oxygen saturations were monitored continuously. The                            EC-[id number] scope was introduced through                            the anus and advanced to the the terminal ileum.                            The colonoscopy was performed without difficulty.                            The patient tolerated the procedure well. The                            quality of the bowel preparation was good. The                            terminal ileum, ileocecal valve, appendiceal                            orifice, and rectum were photographed. Findings:      The entire examined colon appeared normal. Impression:               - The entire examined colon is normal.                           - No specimens collected. Moderate Sedation:      N/A- Per Anesthesia Care Recommendation:           - Patient has a contact number available for                            emergencies. The signs  and symptoms of potential                            delayed complications were discussed with the                            patient. Return to normal activities tomorrow.                            Written discharge instructions were provided to the                            patient.                           - Resume previous diet.                           - Continue present medications.                           - Repeat colonoscopy in 10 years for surveillance. Procedure Code(s):        --- Professional ---                           [phone], Colonoscopy, flexible; diagnostic, including                            collection of specimen(s) by brushing or washing,                            when performed (separate procedure) Diagnosis Code(s):        --- Professional ---                           D50.9, Iron deficiency anemia, unspecified CPT  copyright 2016 American Medical Association. All rights reserved. The codes documented in this report are preliminary and upon coder review may  be revised to meet current compliance requirements. Carol Ada, MD Carol Ada, MD 07/21/2016 5:36:47 PM This report has been signed electronically. Number of Addenda: 0

## 2016-07-21 NOTE — H&P (View-Only) (Signed)
PROGRESS NOTE    Veronica Francis  [id number] DOB: Oct 26, 1976 DOA: 07/19/2016 PCP: Philis Fendt, MD  Brief Narrative: Veronica Francis  is a 40 y.o. female, w/ hx of anemia, apparently c/o slight leg swelling for the past 5 days and feeling tired.  Apparently seen at [facility] but left before her labs came back, and then today was told that she was anemic.  She went to see her pcp and was sent to ER for low Hgb.  Pt denies brbpr, black stool, hematuria, vaginal bleeding.    In ED, Hgb 6.2,  Pt will be admitted for transfusion.     Assessment & Plan:   Active Problems:   Anemia  1-Anemia, iron deficiency;  Report constipation, BM maybe once a week. unknown family history for malignancy.  Occult blood negative times one in the ed.  Denies heavy menstrual periods.  Hb improved to 8 after 2 units.  Iron at 10.  Appreciate Dr hung help, plan for endoscopy, colonoscopy today  Received IV iron./   2-Seizure; continue with home medications.  Patient relates that she has not been taking Keppra for long period of time. She was not tolerating keppra.   3-UTI; urine growing more than 100,000 e coli. Ceftriaxone. Follow sensitivity    DVT prophylaxis: SCD Code Status: Full code.  Family Communication: husband Disposition Plan: home in 24 hours.    Consultants:   GI    Procedures:   none   Antimicrobials:  Nitrofurantoin    Subjective: No new complaints.  Relates she doesn't take Keppra  Objective: Vitals:   07/20/16 0645 07/20/16 1357 07/20/16 2118 07/21/16 0503  BP: 132/67 128/77 134/82 113/89  Pulse: 63 71 64 83  Resp: 16 18 18 18   Temp: 98.1 F (36.7 C) 99.4 F (37.4 C) 98.1 F (36.7 C) 98.5 F (36.9 C)  TempSrc: Oral Oral Oral Oral  SpO2: 100% 100% 100% 95%    Intake/Output Summary (Last 24 hours) at 07/21/16 1437 Last data filed at 07/21/16 1226  Gross per 24 hour  Intake            12.33 ml  Output                0 ml  Net            12.33  ml   There were no vitals filed for this visit.  Examination:  General exam: Appears calm and comfortable  Respiratory system: Clear to auscultation. Respiratory effort normal. Cardiovascular system: S1 & S2 heard, RRR. No JVD, murmurs, rubs, gallops or clicks. No pedal edema. Gastrointestinal system: Abdomen is nondistended, soft and nontender. No organomegaly or masses felt. Normal bowel sounds heard. Central nervous system: Alert and oriented. No focal neurological deficits. Extremities: Symmetric 5 x 5 power. Skin: No rashes, lesions or ulcers Psychiatry: Judgement and insight appear normal. Mood & affect appropriate.     Data Reviewed: I have personally reviewed following labs and imaging studies  CBC:  Recent Labs Lab 07/19/16 1732 07/20/16 0842 07/21/16 0826  WBC 4.9 5.1 4.6  HGB 6.2* 8.2* 8.3*  HCT 21.8* 26.8* 27.2*  MCV 70.3* 74.0* 73.5*  PLT 241 240 [id number]   Basic Metabolic Panel:  Recent Labs Lab 07/19/16 1732 07/20/16 0842 07/21/16 0826  NA 140 138 142  K 4.2 3.4* 4.5  CL 112* 110 117*  CO2 21* 22 18*  GLUCOSE 86 159* 76  BUN 12 9 <5*  CREATININE 0.63 0.60 0.57  CALCIUM  8.9 8.7* 9.2   GFR: CrCl cannot be calculated (Unknown ideal weight.). Liver Function Tests:  Recent Labs Lab 07/19/16 1732 07/20/16 0842  AST 35 44*  ALT 64* 63*  ALKPHOS 65 63  BILITOT 0.4 0.7  PROT 6.7 6.1*  ALBUMIN 4.0 3.7   No results for input(s): LIPASE, AMYLASE in the last 168 hours. No results for input(s): AMMONIA in the last 168 hours. Coagulation Profile: No results for input(s): INR, PROTIME in the last 168 hours. Cardiac Enzymes: No results for input(s): CKTOTAL, CKMB, CKMBINDEX, TROPONINI in the last 168 hours. BNP (last 3 results) No results for input(s): PROBNP in the last 8760 hours. HbA1C: No results for input(s): HGBA1C in the last 72 hours. CBG: No results for input(s): GLUCAP in the last 168 hours. Lipid Profile: No results for input(s): CHOL,  HDL, LDLCALC, TRIG, CHOLHDL, LDLDIRECT in the last 72 hours. Thyroid Function Tests: No results for input(s): TSH, T4TOTAL, FREET4, T3FREE, THYROIDAB in the last 72 hours. Anemia Panel:  Recent Labs  07/19/16 1820  VITAMINB12 346  FOLATE 7.7  FERRITIN 5*  TIBC 489*  IRON 10*  RETICCTPCT 1.6   Sepsis Labs: No results for input(s): PROCALCITON, LATICACIDVEN in the last 168 hours.  Recent Results (from the past 240 hour(s))  Urine culture     Status: Abnormal (Preliminary result)   Collection Time: 07/19/16  6:51 PM  Result Value Ref Range Status   Specimen Description URINE, CLEAN CATCH  Final   Special Requests NONE  Final   Culture >=100,000 COLONIES/mL ESCHERICHIA COLI (A)  Final   Report Status PENDING  Incomplete         Radiology Studies: Dg Chest 2 View  Result Date: 07/19/2016 CLINICAL DATA:  Arm pain. Lower extremity swelling and increased urinary frequency over the last 4 days. EXAM: CHEST  2 VIEW COMPARISON:  One-view chest x-ray 10/14/2012 FINDINGS: Heart size normal. Lungs are clear. A stimulator device projects over the left chest. The visualized soft tissues and bony thorax are otherwise unremarkable. IMPRESSION: No active cardiopulmonary disease. Electronically Signed   By: San Morelle M.D.   On: 07/19/2016 19:14        Scheduled Meds: . carbamazepine  300 mg Oral BID  . FLUoxetine  10 mg Oral QHS  . FLUoxetine  20 mg Oral Daily  . lacosamide  100 mg Oral BID  . nicotine  21 mg Transdermal Daily  . senna-docusate  2 tablet Oral QHS  . sodium chloride flush  3 mL Intravenous Q12H   Continuous Infusions: . sodium chloride 75 mL/hr at 07/21/16 1043     LOS: 0 days    Time spent: 35 minutes.     Elmarie Shiley, MD [facility] Pager [phone]  If 7PM-7AM, please contact night-coverage [email or web address] Password [facility] 07/21/2016, 2:37 PM

## 2016-07-21 NOTE — Op Note (Signed)
[facility] Patient Name: Veronica Francis Procedure Date: 07/21/2016 MRN: [id number] Attending MD: Carol Ada , MD Date of Birth: 1976-08-20 CSN: [id number] Age: 40 Admit Type: Inpatient Procedure:                Upper GI endoscopy Indications:              Iron deficiency anemia Providers:                Carol Ada, MD, Carolynn Comment, RN, Corliss Parish, Technician Referring MD:              Medicines:                See the colonoscopy report. Complications:            No immediate complications. Estimated Blood Loss:     Estimated blood loss: none. Estimated blood loss                            was minimal. Procedure:                Pre-Anesthesia Assessment:                           - Prior to the procedure, a History and Physical                            was performed, and patient medications and                            allergies were reviewed. The patient's tolerance of                            previous anesthesia was also reviewed. The risks                            and benefits of the procedure and the sedation                            options and risks were discussed with the patient.                            All questions were answered, and informed consent                            was obtained. Prior Anticoagulants: The patient has                            taken no previous anticoagulant or antiplatelet                            agents. ASA Grade Assessment: III - A patient with                            severe  systemic disease. After reviewing the risks                            and benefits, the patient was deemed in                            satisfactory condition to undergo the procedure.                           - Sedation was administered by an endoscopy nurse.                            The sedation level attained was moderate.                           After obtaining informed consent, the  endoscope was                            passed under direct vision. Throughout the                            procedure, the patient's blood pressure, pulse, and                            oxygen saturations were monitored continuously. The                            EC-[id number] scope was introduced through                            the mouth, and advanced to the second part of                            duodenum. The upper GI endoscopy was accomplished                            without difficulty. The patient tolerated the                            procedure well. Scope In: Scope Out: Findings:      The esophagus was normal.      Patchy mild inflammation characterized by erosions and erythema was       found in the gastric antrum. Biopsies were taken with a cold forceps for       histology.      Patchy mild inflammation characterized by erosions and erythema was       found in the duodenal bulb.      The current findings in the upper GI tract does not explain the severity       of the anemia. I am dubious about a GI source as the sole cause of the       anemia. Impression:               - Normal esophagus.                           -  Gastritis. Biopsied.                           - Duodenitis. Moderate Sedation:      Moderate (conscious) sedation was administered by the endoscopy nurse       and supervised by the endoscopist. The following parameters were       monitored: oxygen saturation, heart rate, blood pressure, and response       to care. Recommendation:           - Patient has a contact number available for                            emergencies. The signs and symptoms of potential                            delayed complications were discussed with the                            patient. Return to normal activities tomorrow.                            Written discharge instructions were provided to the                            patient.                            - Resume regular diet.                           - Continue present medications.                           - Await pathology results.                           - No repeat upper endoscopy.                           - PCP to follow up with [facility]. If there is a                            persistent IDA then a capsule endoscopy is required.                           - Avoid NSAIDs.                           - Signing off. Procedure Code(s):        --- Professional ---                           [id number], Esophagogastroduodenoscopy, flexible,                            transoral; with biopsy, single or multiple Diagnosis Code(s):        ---  Professional ---                           K29.70, Gastritis, unspecified, without bleeding                           K29.80, Duodenitis without bleeding                           D50.9, Iron deficiency anemia, unspecified CPT copyright 2016 [employer] Association. All rights reserved. The codes documented in this report are preliminary and upon coder review may  be revised to meet current compliance requirements. Carol Ada, MD Carol Ada, MD 07/21/2016 5:40:49 PM This report has been signed electronically. Number of Addenda: 0

## 2016-07-22 LAB — URINE CULTURE: Culture: >=100000 — AB

## 2016-07-22 LAB — CBC
HEMATOCRIT: 27.9 % — AB (ref 36.0–46.0)
HEMOGLOBIN: 8.3 g/dL — AB (ref 12.0–15.0)
MCH: 21.9 pg — ABNORMAL LOW (ref 26.0–34.0)
MCHC: 29.7 g/dL — ABNORMAL LOW (ref 30.0–36.0)
MCV: 73.6 fL — ABNORMAL LOW (ref 78.0–100.0)
Platelets: 285 10*3/uL (ref 150–400)
RBC: 3.79 MIL/uL — ABNORMAL LOW (ref 3.87–5.11)
RDW: 21.9 % — AB (ref 11.5–15.5)
WBC: 5.6 10*3/uL (ref 4.0–10.5)

## 2016-07-22 LAB — BASIC METABOLIC PANEL WITH GFR
Anion gap: 5 (ref 5–15)
BUN: 5 mg/dL — ABNORMAL LOW (ref 6–20)
CO2: 24 mmol/L (ref 22–32)
Calcium: 9.1 mg/dL (ref 8.9–10.3)
Chloride: 110 mmol/L (ref 101–111)
Creatinine, Ser: 0.51 mg/dL (ref 0.44–1.00)
GFR calc Af Amer: >60 mL/min
GFR calc non Af Amer: >60 mL/min
Glucose, Bld: 87 mg/dL (ref 65–99)
Potassium: 3.8 mmol/L (ref 3.5–5.1)
Sodium: 139 mmol/L (ref 135–145)

## 2016-07-22 MED ORDER — SACCHAROMYCES BOULARDII 250 MG PO CAPS: 250.0000 mg | ORAL_CAPSULE | Freq: Two times a day (BID) | ORAL | 0 refills | Status: DC

## 2016-07-22 MED ORDER — FERROUS SULFATE 325 (65 FE) MG PO TABS: 325.0000 mg | ORAL_TABLET | Freq: Two times a day (BID) | ORAL | 1 refills | Status: DC

## 2016-07-22 MED ORDER — FERROUS SULFATE 325 (65 FE) MG PO TABS
325.0000 mg | ORAL_TABLET | Freq: Two times a day (BID) | ORAL | Status: DC
  Administered 2016-07-22: 325 mg via ORAL
  Filled 2016-07-22: qty 1

## 2016-07-22 MED ORDER — PANTOPRAZOLE SODIUM 40 MG PO TBEC
40.0000 mg | DELAYED_RELEASE_TABLET | Freq: Two times a day (BID) | ORAL | Status: DC
  Administered 2016-07-22: 40 mg via ORAL
  Filled 2016-07-22: qty 1

## 2016-07-22 MED ORDER — CEPHALEXIN 500 MG PO CAPS: 500.0000 mg | ORAL_CAPSULE | Freq: Two times a day (BID) | ORAL | 0 refills | Status: DC

## 2016-07-22 MED ORDER — PANTOPRAZOLE SODIUM 40 MG PO TBEC: 40.0000 mg | DELAYED_RELEASE_TABLET | Freq: Every day | ORAL | 0 refills | Status: DC

## 2016-07-22 NOTE — Progress Notes (Signed)
Patient discharged.  Leaving with personal belongings, prescriptions, and information about iron rich diet.  Patient reports understanding of discharge instructions.  Room air.  Denies pain.  No s/s of distress.  Accompanied by husband.

## 2016-07-22 NOTE — Discharge Summary (Signed)
Physician Discharge Summary  Veronica Francis [id number] DOB: 06-Feb-1976 DOA: 07/19/2016  PCP: Philis Fendt, MD  Admit date: 07/19/2016 Discharge date: 07/22/2016  Admitted From: Home  Disposition:  Home   Recommendations for Outpatient Follow-up:  1. Follow up with PCP in 1-2 weeks 2. Please obtain BMP/CBC in one week 3. Might need further evaluation for anemia, capsule endoscopy , see Dr hung recommendation.  4. Follow urine culture sensitivity    Discharge Condition: Stable.  CODE STATUS: full code.  Diet recommendation: Heart Healthy   Brief/Interim Summary: Brief Narrative: ElanMoore-Adamsis a 40 y.o.female,w/ hx of anemia, apparently c/o slight leg swelling for the past 5 days and feeling tired. Apparently seen at [facility] but left before her labs came back, and then today was told that she was anemic. She went to see her pcp and was sent to ER for low Hgb. Pt denies brbpr, black stool, hematuria, vaginal bleeding.   In ED, Hgb 6.2, Pt will be admitted for transfusion.    Assessment & Plan:   Active Problems:   Anemia  1-Anemia, iron deficiency;  Report constipation, BM maybe once a week. unknown family history for malignancy.  Occult blood negative times one in the ed.  Denies heavy menstrual periods.  Hb improved to 8 after 2 units.  Iron at 10.  Appreciate Dr hung help, plan for endoscopy, colonoscopy today  Received IV iron./  Discharge on oral ferrous sulfate.  Need cbc in 1 week.  Endoscopy; Normal esophagus.- Gastritis. Biopsied. - Duodenitis. Discharge on protonix.  Colonoscopy: The entire examined colon appeared normal Dr hung recommend capsule endoscopy if iron deficiency anemia persist.   2-Seizure; continue with home medications.  Patient relates that she has not been taking Keppra for long period of time. She was not tolerating keppra.   3-UTI; urine growing more than 100,000 e coli. Ceftriaxone for one day. Discharge on keflex  for 2 more days.    Discharge Diagnoses:    Severe Anemia, iron deficiency.    Seizures ([place])   UTI (lower urinary tract infection)     Discharge Instructions  Discharge Instructions    Diet - low sodium heart healthy    Complete by:  As directed   Increase activity slowly    Complete by:  As directed       Medication List    STOP taking these medications   diphenhydrAMINE 25 MG tablet Commonly known as:  BENADRYL   ibuprofen 200 MG tablet Commonly known as:  ADVIL,MOTRIN   levETIRAcetam 1000 MG tablet Commonly known as:  KEPPRA   nitrofurantoin (macrocrystal-monohydrate) 100 MG capsule Commonly known as:  MACROBID   phenazopyridine 200 MG tablet Commonly known as:  PYRIDIUM   triamcinolone cream 0.1 % Commonly known as:  KENALOG     TAKE these medications   carbamazepine 100 MG 12 hr tablet Commonly known as:  TEGRETOL XR Take 300 mg by mouth 2 (two) times daily.   cephALEXin 500 MG capsule Commonly known as:  KEFLEX Take 1 capsule (500 mg total) by mouth 2 (two) times daily.   ferrous sulfate 325 (65 FE) MG tablet Take 1 tablet (325 mg total) by mouth 2 (two) times daily with a meal.   FLUoxetine 10 MG capsule Commonly known as:  PROZAC Take 10-20 mg by mouth 2 (two) times daily. Takes 20mg  in the morning and 10mg  in the evening   LORazepam 1 MG tablet Commonly known as:  ATIVAN Take 1 mg by mouth as  needed for seizure.   pantoprazole 40 MG tablet Commonly known as:  PROTONIX Take 1 tablet (40 mg total) by mouth daily.   saccharomyces boulardii 250 MG capsule Commonly known as:  FLORASTOR Take 1 capsule (250 mg total) by mouth 2 (two) times daily.   VENTOLIN HFA 108 (90 Base) MCG/ACT inhaler Generic drug:  albuterol Inhale 2 puffs into the lungs every 6 (six) hours as needed for wheezing or shortness of breath.   VIMPAT 100 MG Tabs Generic drug:  Lacosamide Take 100 mg by mouth 2 (two) times daily.      Follow-up Information    Philis Fendt, MD Follow up in 1 week(s).   Specialty:  Internal Medicine Contact information: [place] [address] [phone]          Allergies  Allergen Reactions  . Morphine And Related Other (See Comments)    Increases heart rate.    Consultations:  Dr Benson [place]    Procedures/Studies: Dg Chest 2 View  Result Date: 07/19/2016 CLINICAL DATA:  Arm pain. Lower extremity swelling and increased urinary frequency over the last 4 days. EXAM: CHEST  2 VIEW COMPARISON:  One-view chest x-ray 10/14/2012 FINDINGS: Heart size normal. Lungs are clear. A stimulator device projects over the left chest. The visualized soft tissues and bony thorax are otherwise unremarkable. IMPRESSION: No active cardiopulmonary disease. Electronically Signed   By: San Morelle M.D.   On: 07/19/2016 19:14   Endoscopy/colonoscopy; see full report    Subjective: She is feeling well.  Mild swelling around both eyes, no redness, no pain. No itching, no rash.   Discharge Exam: Vitals:   07/21/16 2108 07/22/16 0511  BP: 134/84 113/84  Pulse: 71 70  Resp: 18 18  Temp: 98.3 F (36.8 C) 98.1 F (36.7 C)   Vitals:   07/21/16 1740 07/21/16 1745 07/21/16 2108 07/22/16 0511  BP: (!) 148/63 (!) 150/64 134/84 113/84  Pulse: 73 70 71 70  Resp: 18 18 18 18   Temp:   98.3 F (36.8 C) 98.1 F (36.7 C)  TempSrc:   Oral Oral  SpO2: 100% 100% 100% 100%  Weight:    78.5 kg (173 lb)  Height:        General: Pt is alert, awake, not in acute distress Cardiovascular: RRR, S1/S2 +, no rubs, no gallops Respiratory: CTA bilaterally, no wheezing, no rhonchi Abdominal: Soft, NT, ND, bowel sounds + Extremities: no edema, no cyanosis    The results of significant diagnostics from this hospitalization (including imaging, microbiology, ancillary and laboratory) are listed below for reference.     Microbiology: Recent Results (from the past 240 hour(s))  Urine culture     Status: Abnormal    Collection Time: 07/19/16  6:51 PM  Result Value Ref Range Status   Specimen Description URINE, CLEAN CATCH  Final   Special Requests NONE  Final   Culture >=100,000 COLONIES/mL ESCHERICHIA COLI (A)  Final   Report Status 07/22/2016 FINAL  Final   Organism ID, Bacteria ESCHERICHIA COLI (A)  Final      Susceptibility   Escherichia coli - MIC*    AMPICILLIN 4 SENSITIVE Sensitive     CEFAZOLIN <=4 SENSITIVE Sensitive     CEFTRIAXONE <=1 SENSITIVE Sensitive     CIPROFLOXACIN 0.5 SENSITIVE Sensitive     GENTAMICIN <=1 SENSITIVE Sensitive     IMIPENEM <=0.25 SENSITIVE Sensitive     NITROFURANTOIN <=16 SENSITIVE Sensitive     TRIMETH/SULFA <=20 SENSITIVE Sensitive  AMPICILLIN/SULBACTAM 4 SENSITIVE Sensitive     PIP/TAZO <=4 SENSITIVE Sensitive     Extended ESBL NEGATIVE Sensitive     * >=100,000 COLONIES/mL ESCHERICHIA COLI     Labs: BNP (last 3 results)  Recent Labs  07/19/16 1820  BNP [id number]   Basic Metabolic Panel:  Recent Labs Lab 07/19/16 1732 07/20/16 0842 07/21/16 0826 07/22/16 0452  NA 140 138 142 139  K 4.2 3.4* 4.5 3.8  CL 112* 110 117* 110  CO2 21* 22 18* 24  GLUCOSE 86 159* 76 87  BUN 12 9 <5* 5*  CREATININE 0.63 0.60 0.57 0.51  CALCIUM 8.9 8.7* 9.2 9.1   Liver Function Tests:  Recent Labs Lab 07/19/16 1732 07/20/16 0842  AST 35 44*  ALT 64* 63*  ALKPHOS 65 63  BILITOT 0.4 0.7  PROT 6.7 6.1*  ALBUMIN 4.0 3.7   No results for input(s): LIPASE, AMYLASE in the last 168 hours. No results for input(s): AMMONIA in the last 168 hours. CBC:  Recent Labs Lab 07/19/16 1732 07/20/16 0842 07/21/16 0826 07/22/16 0452  WBC 4.9 5.1 4.6 5.6  HGB 6.2* 8.2* 8.3* 8.3*  HCT 21.8* 26.8* 27.2* 27.9*  MCV 70.3* 74.0* 73.5* 73.6*  PLT 241 240 252 285   Cardiac Enzymes: No results for input(s): CKTOTAL, CKMB, CKMBINDEX, TROPONINI in the last 168 hours. BNP: Invalid input(s): POCBNP CBG: No results for input(s): GLUCAP in the last 168 hours. D-Dimer No  results for input(s): DDIMER in the last 72 hours. Hgb A1c No results for input(s): HGBA1C in the last 72 hours. Lipid Profile No results for input(s): CHOL, HDL, LDLCALC, TRIG, CHOLHDL, LDLDIRECT in the last 72 hours. Thyroid function studies No results for input(s): TSH, T4TOTAL, T3FREE, THYROIDAB in the last 72 hours.  Invalid input(s): FREET3 Anemia work up  Recent Labs  07/19/16 1820  VITAMINB12 346  FOLATE 7.7  FERRITIN 5*  TIBC 489*  IRON 10*  RETICCTPCT 1.6   Urinalysis    Component Value Date/Time   COLORURINE YELLOW 07/19/2016 1851   APPEARANCEUR CLOUDY (A) 07/19/2016 1851   LABSPEC 1.017 07/19/2016 1851   PHURINE 5.5 07/19/2016 1851   GLUCOSEU NEGATIVE 07/19/2016 1851   HGBUR NEGATIVE 07/19/2016 1851   BILIRUBINUR NEGATIVE 07/19/2016 1851   KETONESUR NEGATIVE 07/19/2016 1851   PROTEINUR NEGATIVE 07/19/2016 1851   UROBILINOGEN 0.2 09/23/2015 1338   NITRITE POSITIVE (A) 07/19/2016 1851   LEUKOCYTESUR NEGATIVE 07/19/2016 1851   Sepsis Labs Invalid input(s): PROCALCITONIN,  WBC,  LACTICIDVEN Microbiology Recent Results (from the past 240 hour(s))  Urine culture     Status: Abnormal   Collection Time: 07/19/16  6:51 PM  Result Value Ref Range Status   Specimen Description URINE, CLEAN CATCH  Final   Special Requests NONE  Final   Culture >=100,000 COLONIES/mL ESCHERICHIA COLI (A)  Final   Report Status 07/22/2016 FINAL  Final   Organism ID, Bacteria ESCHERICHIA COLI (A)  Final      Susceptibility   Escherichia coli - MIC*    AMPICILLIN 4 SENSITIVE Sensitive     CEFAZOLIN <=4 SENSITIVE Sensitive     CEFTRIAXONE <=1 SENSITIVE Sensitive     CIPROFLOXACIN 0.5 SENSITIVE Sensitive     GENTAMICIN <=1 SENSITIVE Sensitive     IMIPENEM <=0.25 SENSITIVE Sensitive     NITROFURANTOIN <=16 SENSITIVE Sensitive     TRIMETH/SULFA <=20 SENSITIVE Sensitive     AMPICILLIN/SULBACTAM 4 SENSITIVE Sensitive     PIP/TAZO <=4 SENSITIVE Sensitive     Extended  ESBL NEGATIVE  Sensitive     * >=100,000 COLONIES/mL ESCHERICHIA COLI     Time coordinating discharge: Over 30 minutes  SIGNED:   Elmarie Shiley, MD  [facility] 07/22/2016, 1:14 PM Pager [phone]  If 7PM-7AM, please contact night-coverage [email or web address] Password [email or web address]

## 2016-07-22 NOTE — Progress Notes (Signed)
Chart reviewed. No NCM needs identified. Abagael Kramm RN CCM  

## 2016-07-22 NOTE — Discharge Instructions (Signed)
Anemia, Nonspecific Anemia is a condition in which the concentration of red blood cells or hemoglobin in the blood is below normal. Hemoglobin is a substance in red blood cells that carries oxygen to the tissues of the body. Anemia results in not enough oxygen reaching these tissues.  CAUSES  Common causes of anemia include:   Excessive bleeding. Bleeding may be internal or external. This includes excessive bleeding from periods (in women) or from the intestine.   Poor nutrition.   Chronic kidney, thyroid, and liver disease.  Bone marrow disorders that decrease red blood cell production.  Cancer and treatments for cancer.  HIV, AIDS, and their treatments.  Spleen problems that increase red blood cell destruction.  Blood disorders.  Excess destruction of red blood cells due to infection, medicines, and autoimmune disorders. SIGNS AND SYMPTOMS   Minor weakness.   Dizziness.   Headache.  Palpitations.   Shortness of breath, especially with exercise.   Paleness.  Cold sensitivity.  Indigestion.  Nausea.  Difficulty sleeping.  Difficulty concentrating. Symptoms may occur suddenly or they may develop slowly.  DIAGNOSIS  Additional blood tests are often needed. These help your health care provider determine the best treatment. Your health care provider will check your stool for blood and look for other causes of blood loss.  TREATMENT  Treatment varies depending on the cause of the anemia. Treatment can include:   Supplements of iron, vitamin B12, or folic acid.   Hormone medicines.   A blood transfusion. This may be needed if blood loss is severe.   Hospitalization. This may be needed if there is significant continual blood loss.   Dietary changes.  Spleen removal. HOME CARE INSTRUCTIONS Keep all follow-up appointments. It often takes many weeks to correct anemia, and having your health care provider check on your condition and your response to  treatment is very important. SEEK IMMEDIATE MEDICAL CARE IF:   You develop extreme weakness, shortness of breath, or chest pain.   You become dizzy or have trouble concentrating.  You develop heavy vaginal bleeding.   You develop a rash.   You have bloody or black, tarry stools.   You faint.   You vomit up blood.   You vomit repeatedly.   You have abdominal pain.  You have a fever or persistent symptoms for more than 2-3 days.   You have a fever and your symptoms suddenly get worse.   You are dehydrated.  MAKE SURE YOU:  Understand these instructions.  Will watch your condition.  Will get help right away if you are not doing well or get worse.   This information is not intended to replace advice given to you by your health care provider. Make sure you discuss any questions you have with your health care provider.   Document Released: 12/28/2004 Document Revised: 07/23/2013 Document Reviewed: 05/16/2013 Elsevier Interactive Patient Education 2016 Elsevier Inc.  

## 2016-07-24 ENCOUNTER — Encounter (HOSPITAL_COMMUNITY): Payer: Self-pay | Admitting: Gastroenterology

## 2017-05-27 ENCOUNTER — Encounter (HOSPITAL_COMMUNITY): Payer: Self-pay | Admitting: *Deleted

## 2017-05-27 ENCOUNTER — Emergency Department (HOSPITAL_COMMUNITY)
Admission: EM | Admit: 2017-05-27 | Discharge: 2017-05-27 | Disposition: A | Discharge status: Home / Self Care | Payer: Medicare HMO | Attending: Emergency Medicine | Admitting: Emergency Medicine

## 2017-05-27 DIAGNOSIS — G40909 Epilepsy, unspecified, not intractable, without status epilepticus: Secondary | ICD-10-CM | POA: Diagnosis not present

## 2017-05-27 DIAGNOSIS — Z79899 Other long term (current) drug therapy: Secondary | ICD-10-CM | POA: Diagnosis not present

## 2017-05-27 DIAGNOSIS — I10 Essential (primary) hypertension: Secondary | ICD-10-CM | POA: Diagnosis not present

## 2017-05-27 DIAGNOSIS — F17200 Nicotine dependence, unspecified, uncomplicated: Secondary | ICD-10-CM | POA: Insufficient documentation

## 2017-05-27 DIAGNOSIS — R569 Unspecified convulsions: Secondary | ICD-10-CM | POA: Diagnosis present

## 2017-05-27 NOTE — ED Notes (Signed)
Pt ambulated to rr with no assistance needed. Pt tolerated well.

## 2017-05-27 NOTE — ED Notes (Addendum)
Pt husband at bedside with pt. He states she does not need to be treated for anything today and the cause of her seizure is becoming overheated. He is requesting physician dc the pt. I explained to husband the physician would be in to evaluate pt and discuss tx plan soon.
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
## 2017-09-03 ENCOUNTER — Encounter (HOSPITAL_COMMUNITY): Payer: Self-pay | Admitting: Emergency Medicine

## 2017-09-03 ENCOUNTER — Emergency Department (HOSPITAL_COMMUNITY)
Admission: EM | Admit: 2017-09-03 | Discharge: 2017-09-03 | Disposition: A | Discharge status: Home / Self Care | Payer: Medicare HMO | Attending: Emergency Medicine | Admitting: Emergency Medicine

## 2017-09-03 DIAGNOSIS — R519 Headache, unspecified: Secondary | ICD-10-CM

## 2017-09-03 DIAGNOSIS — F17210 Nicotine dependence, cigarettes, uncomplicated: Secondary | ICD-10-CM | POA: Insufficient documentation

## 2017-09-03 DIAGNOSIS — Z79899 Other long term (current) drug therapy: Secondary | ICD-10-CM | POA: Diagnosis not present

## 2017-09-03 DIAGNOSIS — R51 Headache: Secondary | ICD-10-CM | POA: Insufficient documentation

## 2017-09-03 MED ORDER — METOCLOPRAMIDE HCL 5 MG/ML IJ SOLN
10.0000 mg | Freq: Once | INTRAMUSCULAR | Status: AC
  Administered 2017-09-03: 10 mg via INTRAVENOUS
  Filled 2017-09-03: qty 2

## 2017-09-03 MED ORDER — DEXAMETHASONE SODIUM PHOSPHATE 10 MG/ML IJ SOLN
10.0000 mg | Freq: Once | INTRAMUSCULAR | Status: AC
  Administered 2017-09-03: 10 mg via INTRAVENOUS
  Filled 2017-09-03: qty 1

## 2017-09-03 MED ORDER — DIPHENHYDRAMINE HCL 50 MG/ML IJ SOLN
12.5000 mg | Freq: Once | INTRAMUSCULAR | Status: AC
  Administered 2017-09-03: 12.5 mg via INTRAVENOUS
  Filled 2017-09-03: qty 1

## 2017-09-03 MED ORDER — KETOROLAC TROMETHAMINE 15 MG/ML IJ SOLN
15.0000 mg | Freq: Once | INTRAMUSCULAR | Status: AC
  Administered 2017-09-03: 15 mg via INTRAVENOUS
  Filled 2017-09-03: qty 1

## 2017-09-03 MED ORDER — OXYCODONE-ACETAMINOPHEN 5-325 MG PO TABS
1.0000 | ORAL_TABLET | ORAL | Status: DC | PRN
  Administered 2017-09-03: 1 via ORAL

## 2017-09-03 MED ORDER — OXYCODONE-ACETAMINOPHEN 5-325 MG PO TABS
ORAL_TABLET | ORAL | Status: DC
Start: 2017-09-03 — End: 2017-09-03
  Filled 2017-09-03: qty 1

## 2017-09-03 NOTE — ED Triage Notes (Signed)
Reports headache ongoing since seizure at home on Monday - states she was in bed sleeping at that time. Ibuprofen not effective. Pt given narcotic pain medicine in triage. Advised of side effects and instructed to avoid driving for a minimum of four hours.

## 2017-09-03 NOTE — Discharge Instructions (Signed)
Follow up with your neurologist for recheck if headache returns. Return here if your headache becomes worse or for new concerning symptoms.
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
## 2017-10-07 ENCOUNTER — Encounter (HOSPITAL_COMMUNITY): Payer: Self-pay | Admitting: *Deleted

## 2017-10-07 ENCOUNTER — Inpatient Hospital Stay (HOSPITAL_COMMUNITY)
Admission: AD | Admit: 2017-10-07 | Discharge: 2017-10-07 | Disposition: A | Discharge status: Home / Self Care | Payer: Medicare HMO | Source: Ambulatory Visit | Attending: Family Medicine | Admitting: Family Medicine

## 2017-10-07 DIAGNOSIS — Z885 Allergy status to narcotic agent status: Secondary | ICD-10-CM | POA: Diagnosis not present

## 2017-10-07 DIAGNOSIS — N390 Urinary tract infection, site not specified: Secondary | ICD-10-CM | POA: Insufficient documentation

## 2017-10-07 DIAGNOSIS — Z808 Family history of malignant neoplasm of other organs or systems: Secondary | ICD-10-CM | POA: Insufficient documentation

## 2017-10-07 DIAGNOSIS — Z833 Family history of diabetes mellitus: Secondary | ICD-10-CM | POA: Diagnosis not present

## 2017-10-07 DIAGNOSIS — Z8249 Family history of ischemic heart disease and other diseases of the circulatory system: Secondary | ICD-10-CM | POA: Diagnosis not present

## 2017-10-07 DIAGNOSIS — F17210 Nicotine dependence, cigarettes, uncomplicated: Secondary | ICD-10-CM | POA: Diagnosis not present

## 2017-10-07 DIAGNOSIS — D649 Anemia, unspecified: Secondary | ICD-10-CM | POA: Diagnosis not present

## 2017-10-07 DIAGNOSIS — Z9889 Other specified postprocedural states: Secondary | ICD-10-CM | POA: Diagnosis not present

## 2017-10-07 DIAGNOSIS — Z8742 Personal history of other diseases of the female genital tract: Secondary | ICD-10-CM | POA: Diagnosis not present

## 2017-10-07 DIAGNOSIS — Z791 Long term (current) use of non-steroidal anti-inflammatories (NSAID): Secondary | ICD-10-CM | POA: Diagnosis not present

## 2017-10-07 DIAGNOSIS — Z9049 Acquired absence of other specified parts of digestive tract: Secondary | ICD-10-CM | POA: Diagnosis not present

## 2017-10-07 DIAGNOSIS — Z8619 Personal history of other infectious and parasitic diseases: Secondary | ICD-10-CM

## 2017-10-07 DIAGNOSIS — N3000 Acute cystitis without hematuria: Secondary | ICD-10-CM

## 2017-10-07 DIAGNOSIS — N760 Acute vaginitis: Secondary | ICD-10-CM | POA: Insufficient documentation

## 2017-10-07 DIAGNOSIS — R569 Unspecified convulsions: Secondary | ICD-10-CM | POA: Insufficient documentation

## 2017-10-07 DIAGNOSIS — N898 Other specified noninflammatory disorders of vagina: Secondary | ICD-10-CM | POA: Diagnosis not present

## 2017-10-07 DIAGNOSIS — K5909 Other constipation: Secondary | ICD-10-CM | POA: Insufficient documentation

## 2017-10-07 DIAGNOSIS — Z7982 Long term (current) use of aspirin: Secondary | ICD-10-CM | POA: Diagnosis not present

## 2017-10-07 DIAGNOSIS — Z79899 Other long term (current) drug therapy: Secondary | ICD-10-CM | POA: Diagnosis not present

## 2017-10-07 LAB — URINALYSIS, ROUTINE W REFLEX MICROSCOPIC
Bilirubin Urine: NEGATIVE
Glucose, UA: NEGATIVE mg/dL
Hgb urine dipstick: NEGATIVE
KETONES UR: NEGATIVE mg/dL
Nitrite: POSITIVE — AB
PH: 7 (ref 5.0–8.0)
PROTEIN: NEGATIVE mg/dL
Specific Gravity, Urine: 1.016 (ref 1.005–1.030)

## 2017-10-07 LAB — POCT PREGNANCY, URINE: Preg Test, Ur: NEGATIVE

## 2017-10-07 LAB — WET PREP, GENITAL
Sperm: NONE SEEN
TRICH WET PREP: NONE SEEN
YEAST WET PREP: NONE SEEN

## 2017-10-07 MED ORDER — SULFAMETHOXAZOLE-TRIMETHOPRIM 800-160 MG PO TABS: ORAL_TABLET | ORAL | 0 refills | Status: DC

## 2017-10-07 MED ORDER — VALACYCLOVIR HCL 1 G PO TABS: ORAL_TABLET | ORAL | 0 refills | Status: DC

## 2017-10-07 MED ORDER — METRONIDAZOLE 500 MG PO TABS: 500.0000 mg | ORAL_TABLET | Freq: Two times a day (BID) | ORAL | 0 refills | Status: DC

## 2017-10-07 NOTE — MAU Note (Signed)
Pt C/O severe vaginal burning & dryness for the last week.  Denies bleeding or discharge, has vaginal odor.

## 2017-10-07 NOTE — MAU Note (Signed)
Pt thinks she may anemic.  Has hx of genital herpes, unsure if she has an outbreak right now.
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
## 2017-10-07 NOTE — Discharge Instructions (Signed)
Genital Herpes Genital herpes is a common sexually transmitted infection (STI) that is caused by a virus. The virus spreads from person to person through sexual contact. Infection can cause itching, blisters, and sores around the genitals or rectum. Symptoms may last several days and then go away This is called an outbreak. However, the virus remains in your body, so you may have more outbreaks in the future. The time between outbreaks varies and can be months or years. Genital herpes affects men and women. It is particularly concerning for pregnant women because the virus can be passed to the baby during delivery and can cause serious problems. Genital herpes is also a concern for people who have a weak disease-fighting (immune) system. What are the causes? This condition is caused by the herpes simplex virus (HSV) type 1 or type 2. The virus may spread through:  Sexual contact with an infected person, including vaginal, anal, and oral sex.  Contact with fluid from a herpes sore.  The skin. This means that you can get herpes from an infected partner even if he or she does not have a visible sore or does not know that he or she is infected.  What increases the risk? You are more likely to develop this condition if:  You have sex with many partners.  You do not use latex condoms during sex.  What are the signs or symptoms? Most people do not have symptoms (asymptomatic) or have mild symptoms that may be mistaken for other skin problems. Symptoms may include:  Small red bumps near the genitals, rectum, or mouth. These bumps turn into blisters and then turn into sores.  Flu-like symptoms, including: ? Fever. ? Body aches. ? Swollen lymph nodes. ? Headache.  Painful urination.  Pain and itching in the genital area or rectal area.  Vaginal discharge.  Tingling or shooting pain in the legs and buttocks.  Generally, symptoms are more severe and last longer during the first (primary)  outbreak. Flu-like symptoms are also more common during the primary outbreak. How is this diagnosed? Genital herpes may be diagnosed based on:  A physical exam.  Your medical history.  Blood tests.  A test of a fluid sample (culture) from an open sore.  How is this treated? There is no cure for this condition, but treatment with antiviral medicines that are taken by mouth (orally) can do the following:  Speed up healing and relieve symptoms.  Help to reduce the spread of the virus to sexual partners.  Limit the chance of future outbreaks, or make future outbreaks shorter.  Lessen symptoms of future outbreaks.  Your health care provider may also recommend pain relief medicines, such as aspirin or ibuprofen. Follow these instructions at home: Sexual activity  Do not have sexual contact during active outbreaks.  Practice safe sex. Latex condoms and female condoms may help prevent the spread of the herpes virus. General instructions  Keep the affected areas dry and clean.  Take over-the-counter and prescription medicines only as told by your health care provider.  Avoid rubbing or touching blisters and sores. If you do touch blisters or sores: ? Wash your hands thoroughly with soap and water. ? Do not touch your eyes afterward.  To help relieve pain or itching, you may take the following actions as directed by your health care provider: ? Apply a cold, wet cloth (cold compress) to affected areas 4-6 times a day. ? Apply a substance that protects your skin and reduces bleeding (astringent). ?  Apply a gel that helps relieve pain around sores (lidocaine gel). ? Take a warm, shallow bath that cleans the genital area (sitz bath).  Keep all follow-up visits as told by your health care provider. This is important. How is this prevented?  Use condoms. Although anyone can get genital herpes during sexual contact, even with the use of a condom, a condom can provide some  protection.  Avoid having multiple sexual partners.  Talk with your sexual partner about any symptoms either of you may have. Also, talk with your partner about any history of STIs.  Get tested for STIs before you have sex. Ask your partner to do the same.  Do not have sexual contact if you have symptoms of genital herpes. Contact a health care provider if:  Your symptoms are not improving with medicine.  Your symptoms return.  You have new symptoms.  You have a fever.  You have abdominal pain.  You have redness, swelling, or pain in your eye.  You notice new sores on other parts of your body.  You are a woman and experience bleeding between menstrual periods.  You have had herpes and you become pregnant or plan to become pregnant. Summary  Genital herpes is a common sexually transmitted infection (STI) that is caused by the herpes simplex virus (HSV) type 1 or type 2.  These viruses are most often spread through sexual contact with an infected person.  You are more likely to develop this condition if you have sex with many partners or you have unprotected sex.  Most people do not have symptoms (asymptomatic) or have mild symptoms that may be mistaken for other skin problems. Symptoms occur as outbreaks that may happen months or years apart.  There is no cure for this condition, but treatment with oral antiviral medicines can reduce symptoms, reduce the chance of spreading the virus to a partner, prevent future outbreaks, or shorten future outbreaks. This information is not intended to replace advice given to you by your health care provider. Make sure you discuss any questions you have with your health care provider. Document Released: 11/17/2000 Document Revised: 10/20/2016 Document Reviewed: 10/20/2016 Elsevier Interactive Patient Education  2017 [employer] Bacterial Vaginosis Bacterial vaginosis is an infection of the vagina. It happens when too many germs  (bacteria) grow in the vagina. This infection puts you at risk for infections from sex (STIs). Treating this infection can lower your risk for some STIs. You should also treat this if you are pregnant. It can cause your baby to be born early. Follow these instructions at home: Medicines  Take over-the-counter and prescription medicines only as told by your doctor.  Take or use your antibiotic medicine as told by your doctor. Do not stop taking or using it even if you start to feel better. General instructions  If you your sexual partner is a woman, tell her that you have this infection. She needs to get treatment if she has symptoms. If you have a female partner, he does not need to be treated.  During treatment: ? Avoid sex. ? Do not douche. ? Avoid alcohol as told. ? Avoid breastfeeding as told.  Drink enough fluid to keep your pee (urine) clear or pale yellow.  Keep your vagina and butt (rectum) clean. ? Wash the area with warm water every day. ? Wipe from front to back after you use the toilet.  Keep all follow-up visits as told by your doctor. This is important. Preventing this condition  Do not douche.  Use only warm water to wash around your vagina.  Use protection when you have sex. This includes: ? Latex condoms. ? Dental dams.  Limit how many people you have sex with. It is best to only have sex with the same person (be monogamous).  Get tested for STIs. Have your partner get tested.  Wear underwear that is cotton or lined with cotton.  Avoid tight pants and pantyhose. This is most important in summer.  Do not use any products that have nicotine or tobacco in them. These include cigarettes and e-cigarettes. If you need help quitting, ask your doctor.  Do not use illegal drugs.  Limit how much alcohol you drink. Contact a doctor if:  Your symptoms do not get better, even after you are treated.  You have more discharge or pain when you pee (urinate).  You  have a fever.  You have pain in your belly (abdomen).  You have pain with sex.  Your bleed from your vagina between periods. Summary  This infection happens when too many germs (bacteria) grow in the vagina.  Treating this condition can lower your risk for some infections from sex (STIs).  You should also treat this if you are pregnant. It can cause early (premature) birth.  Do not stop taking or using your antibiotic medicine even if you start to feel better. This information is not intended to replace advice given to you by your health care provider. Make sure you discuss any questions you have with your health care provider. Document Released: 08/29/2008 Document Revised: 08/05/2016 Document Reviewed: 08/05/2016 Elsevier Interactive Patient Education  2017 [employer] Urinary Tract Infection, Adult A urinary tract infection (UTI) is an infection of any part of the urinary tract, which includes the kidneys, ureters, bladder, and urethra. These organs make, store, and get rid of urine in the body. UTI can be a bladder infection (cystitis) or kidney infection (pyelonephritis). What are the causes? This infection may be caused by fungi, viruses, or bacteria. Bacteria are the most common cause of UTIs. This condition can also be caused by repeated incomplete emptying of the bladder during urination. What increases the risk? This condition is more likely to develop if:  You ignore your need to urinate or hold urine for long periods of time.  You do not empty your bladder completely during urination.  You wipe back to front after urinating or having a bowel movement, if you are female.  You are uncircumcised, if you are female.  You are constipated.  You have a urinary catheter that stays in place (indwelling).  You have a weak defense (immune) system.  You have a medical condition that affects your bowels, kidneys, or bladder.  You have diabetes.  You take antibiotic  medicines frequently or for long periods of time, and the antibiotics no longer work well against certain types of infections (antibiotic resistance).  You take medicines that irritate your urinary tract.  You are exposed to chemicals that irritate your urinary tract.  You are female.  What are the signs or symptoms? Symptoms of this condition include:  Fever.  Frequent urination or passing small amounts of urine frequently.  Needing to urinate urgently.  Pain or burning with urination.  Urine that smells bad or unusual.  Cloudy urine.  Pain in the lower abdomen or back.  Trouble urinating.  Blood in the urine.  Vomiting or being less hungry than normal.  Diarrhea or abdominal pain.  Vaginal discharge,  if you are female.  How is this diagnosed? This condition is diagnosed with a medical history and physical exam. You will also need to provide a urine sample to test your urine. Other tests may be done, including:  Blood tests.  Sexually transmitted disease (STD) testing.  If you have had more than one UTI, a cystoscopy or imaging studies may be done to determine the cause of the infections. How is this treated? Treatment for this condition often includes a combination of two or more of the following:  Antibiotic medicine.  Other medicines to treat less common causes of UTI.  Over-the-counter medicines to treat pain.  Drinking enough water to stay hydrated.  Follow these instructions at home:  Take over-the-counter and prescription medicines only as told by your health care provider.  If you were prescribed an antibiotic, take it as told by your health care provider. Do not stop taking the antibiotic even if you start to feel better.  Avoid alcohol, caffeine, tea, and carbonated beverages. They can irritate your bladder.  Drink enough fluid to keep your urine clear or pale yellow.  Keep all follow-up visits as told by your health care provider. This is  important.  Make sure to: ? Empty your bladder often and completely. Do not hold urine for long periods of time. ? Empty your bladder before and after sex. ? Wipe from front to back after a bowel movement if you are female. Use each tissue one time when you wipe. Contact a health care provider if:  You have back pain.  You have a fever.  You feel nauseous or vomit.  Your symptoms do not get better after 3 days.  Your symptoms go away and then return. Get help right away if:  You have severe back pain or lower abdominal pain.  You are vomiting and cannot keep down any medicines or water. This information is not intended to replace advice given to you by your health care provider. Make sure you discuss any questions you have with your health care provider. Document Released: 08/30/2005 Document Revised: 05/03/2016 Document Reviewed: 10/11/2015 Elsevier Interactive Patient Education  2017 [employer].

## 2017-10-08 LAB — GC/CHLAMYDIA PROBE AMP (~~LOC~~) NOT AT ARMC
Chlamydia: NEGATIVE
NEISSERIA GONORRHEA: NEGATIVE

## 2017-10-08 LAB — HIV ANTIBODY (ROUTINE TESTING W REFLEX): HIV Screen 4th Generation wRfx: NONREACTIVE

## 2017-11-05 ENCOUNTER — Other Ambulatory Visit: Payer: Self-pay

## 2017-11-05 ENCOUNTER — Encounter (HOSPITAL_COMMUNITY): Payer: Self-pay | Admitting: Emergency Medicine

## 2017-11-05 ENCOUNTER — Emergency Department (HOSPITAL_COMMUNITY)
Admission: EM | Admit: 2017-11-05 | Discharge: 2017-11-05 | Disposition: A | Discharge status: Home / Self Care | Payer: Medicare HMO | Attending: Emergency Medicine | Admitting: Emergency Medicine

## 2017-11-05 DIAGNOSIS — G40909 Epilepsy, unspecified, not intractable, without status epilepticus: Secondary | ICD-10-CM | POA: Insufficient documentation

## 2017-11-05 DIAGNOSIS — Z7982 Long term (current) use of aspirin: Secondary | ICD-10-CM | POA: Insufficient documentation

## 2017-11-05 DIAGNOSIS — F17210 Nicotine dependence, cigarettes, uncomplicated: Secondary | ICD-10-CM | POA: Insufficient documentation

## 2017-11-05 DIAGNOSIS — Z79899 Other long term (current) drug therapy: Secondary | ICD-10-CM | POA: Insufficient documentation

## 2017-11-05 DIAGNOSIS — I10 Essential (primary) hypertension: Secondary | ICD-10-CM | POA: Diagnosis not present

## 2017-11-05 DIAGNOSIS — R569 Unspecified convulsions: Secondary | ICD-10-CM | POA: Diagnosis present

## 2017-11-05 LAB — BASIC METABOLIC PANEL
ANION GAP: 11 (ref 5–15)
BUN: 12 mg/dL (ref 6–20)
CO2: 21 mmol/L — AB (ref 22–32)
Calcium: 9.4 mg/dL (ref 8.9–10.3)
Chloride: 106 mmol/L (ref 101–111)
Creatinine, Ser: 0.64 mg/dL (ref 0.44–1.00)
GFR calc Af Amer: >60 mL/min (ref >60)
GFR calc non Af Amer: >60 mL/min (ref >60)
GLUCOSE: 71 mg/dL (ref 65–99)
Potassium: 3.9 mmol/L (ref 3.5–5.1)
Sodium: 138 mmol/L (ref 135–145)

## 2017-11-05 LAB — CBC WITH DIFFERENTIAL/PLATELET
BASOS PCT: 0 %
Basophils Absolute: 0 10*3/uL (ref 0.0–0.1)
EOS PCT: 0 %
Eosinophils Absolute: 0 10*3/uL (ref 0.0–0.7)
HEMATOCRIT: 31.1 % — AB (ref 36.0–46.0)
Hemoglobin: 8.8 g/dL — ABNORMAL LOW (ref 12.0–15.0)
Lymphocytes Relative: 14 %
Lymphs Abs: 1 10*3/uL (ref 0.7–4.0)
MCH: 21.6 pg — ABNORMAL LOW (ref 26.0–34.0)
MCHC: 28.3 g/dL — AB (ref 30.0–36.0)
MCV: 76.2 fL — AB (ref 78.0–100.0)
MONO ABS: 0.2 10*3/uL (ref 0.1–1.0)
MONOS PCT: 2 %
NEUTROS ABS: 6 10*3/uL (ref 1.7–7.7)
Neutrophils Relative %: 84 %
PLATELETS: 211 10*3/uL (ref 150–400)
RBC: 4.08 MIL/uL (ref 3.87–5.11)
RDW: 20.1 % — AB (ref 11.5–15.5)
WBC: 7.2 10*3/uL (ref 4.0–10.5)

## 2017-11-05 LAB — I-STAT BETA HCG BLOOD, ED (MC, WL, AP ONLY)

## 2017-11-05 LAB — URINALYSIS, ROUTINE W REFLEX MICROSCOPIC
Bilirubin Urine: NEGATIVE
Glucose, UA: NEGATIVE mg/dL
Ketones, ur: NEGATIVE mg/dL
Leukocytes, UA: NEGATIVE
Nitrite: NEGATIVE
Protein, ur: NEGATIVE mg/dL
SPECIFIC GRAVITY, URINE: 1.021 (ref 1.005–1.030)
pH: 5 (ref 5.0–8.0)

## 2017-11-05 LAB — CBG MONITORING, ED: GLUCOSE-CAPILLARY: 105 mg/dL — AB (ref 65–99)

## 2017-11-05 LAB — CARBAMAZEPINE LEVEL, TOTAL: Carbamazepine Lvl: <2 ug/mL — ABNORMAL LOW (ref 4.0–12.0)

## 2017-11-05 MED ORDER — LACOSAMIDE 50 MG PO TABS
100.0000 mg | ORAL_TABLET | Freq: Once | ORAL | Status: AC
  Administered 2017-11-05: 100 mg via ORAL
  Filled 2017-11-05: qty 2

## 2017-11-05 MED ORDER — CARBAMAZEPINE ER 100 MG PO TB12: 500.0000 mg | ORAL_TABLET | Freq: Every day | ORAL | 0 refills | Status: DC

## 2017-11-05 MED ORDER — CARBAMAZEPINE ER 400 MG PO TB12
500.0000 mg | ORAL_TABLET | Freq: Once | ORAL | Status: AC
  Administered 2017-11-05: 19:00:00 500 mg via ORAL
  Filled 2017-11-05: qty 1

## 2017-11-05 MED ORDER — LORAZEPAM 2 MG/ML IJ SOLN
1.0000 mg | Freq: Once | INTRAMUSCULAR | Status: AC
  Administered 2017-11-05: 1 mg via INTRAVENOUS
  Filled 2017-11-05: qty 1

## 2017-11-05 MED ORDER — ACETAMINOPHEN 500 MG PO TABS
1000.0000 mg | ORAL_TABLET | Freq: Once | ORAL | Status: AC
  Administered 2017-11-05: 1000 mg via ORAL
  Filled 2017-11-05: qty 2

## 2017-11-05 MED ORDER — CARBAMAZEPINE ER 200 MG PO TB12: 400.0000 mg | ORAL_TABLET | Freq: Every day | ORAL | 0 refills | Status: DC

## 2017-11-05 MED ORDER — SODIUM CHLORIDE 0.9 % IV SOLN
200.0000 mg | Freq: Once | INTRAVENOUS | Status: AC
  Administered 2017-11-05: 200 mg via INTRAVENOUS
  Filled 2017-11-05: qty 20

## 2017-11-05 MED ORDER — LORAZEPAM 2 MG/ML IJ SOLN
2.0000 mg | Freq: Once | INTRAMUSCULAR | Status: AC
  Administered 2017-11-05: 2 mg via INTRAMUSCULAR

## 2017-11-05 NOTE — ED Notes (Signed)
Pt requesting something for headache. 

## 2017-11-05 NOTE — ED Notes (Signed)
Patient had BM and changed. Patient able to state name and follow commands.
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
## 2017-11-05 NOTE — Consult Note (Signed)
Neurology Consultation Referring Physician: Janetta Hora [facility]  CC: Seizure  History is obtained from: Chart review  HPI: Veronica Francis is a 41 y.o. female with PMH of seizure disorder who presents to [facility] ER after having a seizure in the store earlier today. According to ER provider notes, he was seen to have a seizure in the store and on arrival she was combative and postictal. She has a previous history of seizure disorder and is currently on carbamazepine 300 mg twice a day and Vimpat 100 mg twice daily. She was noticed to have another seizure in the emergency room and received 2 mg of Ativan 1:30 PM. Neurology was consulted for further management.   On assessment the patient had woken up and following commands. Recommended loading her with 200 mg Vimpat and restarting her home medications.    ROS:  Unable to obtain due to altered mental status.   Past Medical History:  Diagnosis Date  . Constipation, chronic   . HSV infection   . Seizures ([place])    Last seizure 09/2017    Family History  Problem Relation Age of Onset  . Diabetes Unknown        "both sides of family"  . Seizures Unknown        2 uncles with seizures  . Cervical cancer Mother   . CAD Father      Social History:  reports that she has been smoking.  She has a 4.00 pack-year smoking history. she has never used smokeless tobacco. She reports that she drinks about 1.2 oz of alcohol per week. She reports that she does not use drugs.   Exam: Current vital signs: BP 123/73   Pulse 79   Temp (!) 97.4 F (36.3 C)   Resp 19   SpO2 100%  Vital signs in last 24 hours: Temp:  [97.4 F (36.3 C)] 97.4 F (36.3 C) (12/03 1226) Pulse Rate:  [72-83] 79 (12/03 1645) Resp:  [13-20] 19 (12/03 1645) BP: (107-128)/(65-92) 123/73 (12/03 1645) SpO2:  [99 %-100 %] 100 % (12/03 1645)   Physical Exam  Constitutional: Appears well-developed and well-nourished.  Psych: Affect appropriate to situation Eyes: No  scleral injection HENT: No OP obstrucion Head: Normocephalic.  Cardiovascular: Normal rate and regular rhythm.  Respiratory: Effort normal, non-labored breathing GI: Soft.  No distension. There is no tenderness.  Skin: WDI  Neuro: Mental Status: Patient is awake but drowsy, alerted place and person  Patient is able to give a clear and coherent history. No signs of aphasia or neglect Cranial Nerves: II: Visual Fields are full. Pupils are equal, round, and reactive to light.  III,IV, VI: EOMI without ptosis or diploplia.  V: Facial sensation is symmetric to temperature VII: Facial movement is symmetric.  VIII: hearing is intact to voice X: Uvula elevates symmetrically XI: Shoulder shrug is symmetric. XII: tongue is midline without atrophy or fasciculations.  Motor: Tone is normal. Bulk is normal. 5/5 strength was present in all four extremities.  Sensory: Sensation is symmetric to light touch and temperature in the arms and legs. Deep Tendon Reflexes: 2+ and symmetric in the biceps and patellae. Plantars Toes are downgoing bilaterally.  Cerebellar: FNF and HKS are intact bilaterally    I have reviewed labs in epic and the results pertinent to this consultation are: CBZ levels <2.0   No images obtained during this admission    Assessment and plan  Breakthrough seizures History of seizure disorder    Recommendations: Load  with 200 mg Vimpat Resume home CBZ and Vimpat  CBZ subtherapeutic, if patient not complaint resume home dose and counsel on importance of taking medications. If she states she is compliant, will increase dose to 450 mg BID.( 300 am and 600 pm)  Monitor till 9pm to make sure she has no seizures Counsel on seizure precautions Check for UTI, infection and electrolyte abnormalities Check Urine drug screen    Per [facility] statutes, patients with seizures are not allowed to drive until they have been seizure-free for six months. Use caution  when using heavy equipment or power tools. Avoid working on ladders or at heights. Take showers instead of baths. Ensure the water temperature is not too high on the home water heater. Do not go swimming alone. Do not lock yourself in a room alone (i.e. bathroom). When caring for infants or small children, sit down when holding, feeding, or changing them to minimize risk of injury to the child in the event you have a seizure. Maintain good sleep hygiene. Avoid alcohol.    If [employer] has another seizure, call 911 and bring them back to the ED if:       A.  The seizure lasts longer than 5 minutes.            B.  The patient doesn't wake shortly after the seizure or has new problems such as difficulty seeing, speaking or moving following the seizure       C.  The patient was injured during the seizure       D.  The patient has a temperature over 102 F (39C)       E.  The patient vomited during the seizure and now is having trouble breathing

## 2017-11-05 NOTE — ED Notes (Signed)
Unable to obtain labs 

## 2017-11-05 NOTE — ED Provider Notes (Signed)
Care assumed from [facility], [place], please see her note for full initial evaluation and exam.  Plan is to watch the patient to make sure that she does not not seize again.  Neurology evaluated the patient and suggests that if the patient is compliant with her medications to increase the Tegretol to 450 mg twice daily because her Tegretol level is low.  She is now alert and oriented.  She was observed for approximately 4-1/2-5 hours with no repeat seizure.  I informed the patient that we wanted to increase the Tegretol and he has been stated that they are not going to change the medicine until she sees her own neurologist.  I informed him that this was per the neurologist recommendation and they still refused however I will give him the prescription for 400 mg of Tegretol in the morning and 500 in the evening to average up to 450 twice daily.  She was informed to follow-up with her neurologist at the next available appointment. HDS at d/c. D/C in good condition.   Ashleyanne Hemmingway Mali, MD 11/05/17 3437    Fatima Blank, MD 11/06/17 [phone]

## 2017-11-05 NOTE — ED Triage Notes (Signed)
Per EMS- pt had seizure in store. Hx of seizures. Post ictal and slightly combative

## 2018-09-04 ENCOUNTER — Other Ambulatory Visit: Payer: Self-pay

## 2018-09-04 ENCOUNTER — Emergency Department (HOSPITAL_COMMUNITY)
Admission: EM | Admit: 2018-09-04 | Discharge: 2018-09-04 | Disposition: A | Discharge status: Home / Self Care | Payer: Medicare HMO | Attending: Emergency Medicine | Admitting: Emergency Medicine

## 2018-09-04 ENCOUNTER — Encounter (HOSPITAL_COMMUNITY): Payer: Self-pay

## 2018-09-04 DIAGNOSIS — F17210 Nicotine dependence, cigarettes, uncomplicated: Secondary | ICD-10-CM | POA: Diagnosis not present

## 2018-09-04 DIAGNOSIS — R569 Unspecified convulsions: Secondary | ICD-10-CM | POA: Diagnosis not present

## 2018-09-04 DIAGNOSIS — Z79899 Other long term (current) drug therapy: Secondary | ICD-10-CM | POA: Diagnosis not present

## 2018-09-04 LAB — CBC WITH DIFFERENTIAL/PLATELET
BASOS ABS: 0 10*3/uL (ref 0.0–0.1)
BASOS PCT: 0 %
EOS ABS: 0.1 10*3/uL (ref 0.0–0.7)
EOS PCT: 1 %
HEMATOCRIT: 38.8 % (ref 36.0–46.0)
Hemoglobin: 12.8 g/dL (ref 12.0–15.0)
Lymphocytes Relative: 18 %
Lymphs Abs: 1 10*3/uL (ref 0.7–4.0)
MCH: 34.5 pg — ABNORMAL HIGH (ref 26.0–34.0)
MCHC: 33 g/dL (ref 30.0–36.0)
MCV: 104.6 fL — ABNORMAL HIGH (ref 78.0–100.0)
MONO ABS: 0.3 10*3/uL (ref 0.1–1.0)
MONOS PCT: 5 %
NEUTROS ABS: 4.1 10*3/uL (ref 1.7–7.7)
Neutrophils Relative %: 76 %
PLATELETS: 143 10*3/uL — AB (ref 150–400)
RBC: 3.71 MIL/uL — ABNORMAL LOW (ref 3.87–5.11)
RDW: 13.9 % (ref 11.5–15.5)
WBC: 5.4 10*3/uL (ref 4.0–10.5)

## 2018-09-04 LAB — BASIC METABOLIC PANEL
Anion gap: 8 (ref 5–15)
BUN: 12 mg/dL (ref 6–20)
CALCIUM: 9.1 mg/dL (ref 8.9–10.3)
CO2: 23 mmol/L (ref 22–32)
CREATININE: 0.64 mg/dL (ref 0.44–1.00)
Chloride: 110 mmol/L (ref 98–111)
GFR calc Af Amer: >60 mL/min (ref >60)
GLUCOSE: 98 mg/dL (ref 70–99)
Potassium: 3.7 mmol/L (ref 3.5–5.1)
Sodium: 141 mmol/L (ref 135–145)

## 2018-09-04 LAB — CARBAMAZEPINE LEVEL, TOTAL: Carbamazepine Lvl: 3.8 ug/mL — ABNORMAL LOW (ref 4.0–12.0)

## 2018-09-04 NOTE — ED Notes (Signed)
Pt is now A&O x4. Pt is able to answer all questions without difficulty.
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
## 2018-09-04 NOTE — Discharge Instructions (Addendum)
You were evaluated in the emergency department for possible seizure.  You were observed for period of time and return to your normal level of alertness.  Please contact your neurologist for follow-up.  Return if any concerns.

## 2018-09-04 NOTE — ED Notes (Signed)
Family at bedside requesting to speak with MD. MD made aware.

## 2018-09-04 NOTE — ED Triage Notes (Signed)
Per EMS pt from home and roommate reports pt had a seizure. Hx of seizures. EMS gave 5mg  Versed because pt became combative and was unaware of where she was at.   20G LF CBG 156  BP 110/60 HR 81  97%

## 2018-09-04 NOTE — ED Notes (Signed)
Bed: RESA Expected date:  Expected time:  Means of arrival:  Comments: EMS seizure 

## 2018-12-09 ENCOUNTER — Encounter: Payer: Self-pay | Admitting: Obstetrics and Gynecology

## 2018-12-09 ENCOUNTER — Other Ambulatory Visit (HOSPITAL_COMMUNITY)
Admission: RE | Admit: 2018-12-09 | Discharge: 2018-12-09 | Disposition: A | Discharge status: Home / Self Care | Payer: Medicare HMO | Source: Ambulatory Visit | Attending: Obstetrics and Gynecology | Admitting: Obstetrics and Gynecology

## 2018-12-09 ENCOUNTER — Other Ambulatory Visit: Payer: Self-pay | Admitting: Obstetrics and Gynecology

## 2018-12-09 ENCOUNTER — Ambulatory Visit (INDEPENDENT_AMBULATORY_CARE_PROVIDER_SITE_OTHER): Payer: Medicare HMO | Admitting: Obstetrics and Gynecology

## 2018-12-09 VITALS — BP 135/89 | HR 66 | Ht 62.0 in | Wt 169.8 lb

## 2018-12-09 DIAGNOSIS — Z1231 Encounter for screening mammogram for malignant neoplasm of breast: Secondary | ICD-10-CM

## 2018-12-09 DIAGNOSIS — N938 Other specified abnormal uterine and vaginal bleeding: Secondary | ICD-10-CM | POA: Diagnosis present

## 2018-12-09 DIAGNOSIS — Z124 Encounter for screening for malignant neoplasm of cervix: Secondary | ICD-10-CM
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
## 2018-12-09 NOTE — Progress Notes (Signed)
NGYN transferred from [facility] to better manage menorrhagia and dysmenorrhea x 1 year. Has been receiving Depo to alleviate VB since August 2019 with minimal relief.

## 2018-12-10 LAB — CYTOLOGY - PAP
Diagnosis: NEGATIVE
HPV: NOT DETECTED

## 2018-12-17 ENCOUNTER — Ambulatory Visit (HOSPITAL_COMMUNITY)
Admission: RE | Admit: 2018-12-17 | Discharge: 2018-12-17 | Disposition: A | Discharge status: Home / Self Care | Payer: Medicare HMO | Source: Ambulatory Visit | Attending: Obstetrics and Gynecology | Admitting: Obstetrics and Gynecology

## 2018-12-17 DIAGNOSIS — N938 Other specified abnormal uterine and vaginal bleeding: Secondary | ICD-10-CM | POA: Diagnosis present

## 2018-12-18 ENCOUNTER — Encounter (HOSPITAL_COMMUNITY): Payer: Self-pay

## 2018-12-18 ENCOUNTER — Other Ambulatory Visit: Payer: Self-pay | Admitting: Obstetrics and Gynecology

## 2018-12-25 ENCOUNTER — Ambulatory Visit: Payer: Medicare HMO | Admitting: Obstetrics and Gynecology

## 2019-01-06 ENCOUNTER — Ambulatory Visit: Payer: Medicare HMO

## 2019-01-07 ENCOUNTER — Inpatient Hospital Stay (HOSPITAL_COMMUNITY)
Admission: RE | Admit: 2019-01-07 | Payer: Medicare HMO | Source: Home / Self Care | Admitting: Obstetrics and Gynecology

## 2019-01-07 SURGERY — HYSTERECTOMY, TOTAL, ABDOMINAL, WITH SALPINGECTOMY
Anesthesia: Choice | Laterality: Bilateral

## 2019-01-31 ENCOUNTER — Ambulatory Visit
Admission: RE | Admit: 2019-01-31 | Discharge: 2019-01-31 | Disposition: A | Discharge status: Home / Self Care | Payer: Medicare HMO | Source: Ambulatory Visit | Attending: Obstetrics and Gynecology | Admitting: Obstetrics and Gynecology

## 2019-01-31 DIAGNOSIS — Z1231 Encounter for screening mammogram for malignant neoplasm of breast: Secondary | ICD-10-CM

## 2019-12-26 ENCOUNTER — Other Ambulatory Visit: Payer: Self-pay | Admitting: Obstetrics and Gynecology

## 2020-01-08 NOTE — Progress Notes (Signed)
Please place surgical orders. Pt is scheduled for her PAT appt on 01-12-20. Thank you.

## 2020-01-08 NOTE — Patient Instructions (Addendum)
Veronica Francis       Your procedure is scheduled on 01-14-20  Report to [place]  at  6:30 A.M.   Call this number if you have problems the morning of surgery:[phone]   OUR ADDRESS IS [place], WE ARE LOCATED IN THE [facility]ICAL [facility] WITH [facility].   Remember:  Do not eat food or drink liquids after midnight.   Take these medicines the morning of surgery with A SIP OF WATER: Carbamzepine (Carbatrol), Fluoxetine (Prozac), and Vimpet   Do not wear jewelry, make-up or nail polish.  Do not wear lotions, powders, or perfumes, or deoderant.  Do not shave 48 hours prior to surgery.  Men may shave face and neck.  Do not bring valuables to the hospital.  [facility] is not responsible for any belongings or valuables.  Contacts, dentures or bridgework may not be worn into surgery.  Leave your suitcase in the car.  After surgery it may be brought to your room.  For patients admitted to the hospital, discharge time will be determined by your treatment team.  Patients discharged the day of surgery will not be allowed to drive home.   Special instructions:  Please bring your medication in your original pill bottles  Please read over the following fact sheets that you were given:                                 [facility] - Preparing for Surgery Before surgery, you can play an important role.  Because skin is not sterile, your skin needs to be as free of germs as possible.  You can reduce the number of germs on your skin by washing with CHG (chlorahexidine gluconate) soap before surgery.  CHG is an antiseptic cleaner which kills germs and bonds with the skin to continue killing germs even after washing. Please DO NOT use if you have an allergy to CHG or antibacterial soaps.  If your skin becomes reddened/irritated stop using the CHG and inform your nurse when you arrive at Short Stay. Do not shave (including legs and underarms) for at least 48  hours prior to the first CHG shower.  You may shave your face/neck. Please follow these instructions carefully:  1.  Shower with CHG Soap the night before surgery and the  morning of Surgery.  2.  If you choose to wash your hair, wash your hair first as usual with your  normal  shampoo.  3.  After you shampoo, rinse your hair and body thoroughly to remove the  shampoo.                           4.  Use CHG as you would any other liquid soap.  You can apply chg directly  to the skin and wash                       Gently with a scrungie or clean washcloth.  5.  Apply the CHG Soap to your body ONLY FROM THE NECK DOWN.   Do not use on face/ open                           Wound or open sores. Avoid contact with eyes, ears mouth and genitals (private parts).  Wash face,  Genitals (private parts) with your normal soap.             6.  Wash thoroughly, paying special attention to the area where your surgery  will be performed.  7.  Thoroughly rinse your body with warm water from the neck down.  8.  DO NOT shower/wash with your normal soap after using and rinsing off  the CHG Soap.                9.  Pat yourself dry with a clean towel.            10.  Wear clean pajamas.            11.  Place clean sheets on your bed the night of your first shower and do not  sleep with pets. Day of Surgery : Do not apply any lotions/deodorants the morning of surgery.  Please wear clean clothes to the hospital/surgery center.  FAILURE TO FOLLOW THESE INSTRUCTIONS MAY RESULT IN THE CANCELLATION OF YOUR SURGERY PATIENT SIGNATURE_________________________________  NURSE SIGNATURE__________________________________  ________________________________________________________________________

## 2020-01-10 ENCOUNTER — Other Ambulatory Visit (HOSPITAL_COMMUNITY)
Admission: RE | Admit: 2020-01-10 | Discharge: 2020-01-10 | Disposition: A | Discharge status: Home / Self Care | Payer: Medicare HMO | Source: Ambulatory Visit | Attending: Obstetrics and Gynecology | Admitting: Obstetrics and Gynecology

## 2020-01-10 DIAGNOSIS — Z20822 Contact with and (suspected) exposure to covid-19: Secondary | ICD-10-CM | POA: Diagnosis not present

## 2020-01-10 DIAGNOSIS — Z01812 Encounter for preprocedural laboratory examination: Secondary | ICD-10-CM | POA: Insufficient documentation

## 2020-01-10 LAB — SARS CORONAVIRUS 2 (TAT 6-24 HRS): SARS Coronavirus 2: NEGATIVE

## 2020-01-12 ENCOUNTER — Other Ambulatory Visit: Payer: Self-pay

## 2020-01-12 ENCOUNTER — Encounter (HOSPITAL_COMMUNITY): Payer: Self-pay

## 2020-01-12 ENCOUNTER — Other Ambulatory Visit: Payer: Self-pay | Admitting: Obstetrics and Gynecology

## 2020-01-12 ENCOUNTER — Encounter (HOSPITAL_COMMUNITY)
Admission: RE | Admit: 2020-01-12 | Discharge: 2020-01-12 | Disposition: A | Discharge status: Home / Self Care | Payer: Medicare HMO | Source: Ambulatory Visit | Attending: Obstetrics and Gynecology | Admitting: Obstetrics and Gynecology

## 2020-01-12 DIAGNOSIS — Z01818 Encounter for other preprocedural examination: Secondary | ICD-10-CM | POA: Insufficient documentation

## 2020-01-12 HISTORY — DX: Anemia, unspecified: D64.9

## 2020-01-12 HISTORY — DX: Anxiety disorder, unspecified: F41.9

## 2020-01-12 HISTORY — DX: Pneumonia, unspecified organism: J18.9

## 2020-01-12 HISTORY — DX: Essential (primary) hypertension: I10

## 2020-01-12 HISTORY — DX: Depression, unspecified: F32.A

## 2020-01-12 LAB — BASIC METABOLIC PANEL
Anion gap: 7 (ref 5–15)
BUN: 18 mg/dL (ref 6–20)
CO2: 27 mmol/L (ref 22–32)
Calcium: 9.7 mg/dL (ref 8.9–10.3)
Chloride: 107 mmol/L (ref 98–111)
Creatinine, Ser: 0.94 mg/dL (ref 0.44–1.00)
GFR calc Af Amer: >60 mL/min (ref >60)
GFR calc non Af Amer: >60 mL/min (ref >60)
Glucose, Bld: 90 mg/dL (ref 70–99)
Potassium: 4 mmol/L (ref 3.5–5.1)
Sodium: 141 mmol/L (ref 135–145)

## 2020-01-12 LAB — CBC
HCT: 38.9 % (ref 36.0–46.0)
Hemoglobin: 12.5 g/dL (ref 12.0–15.0)
MCH: 31.8 pg (ref 26.0–34.0)
MCHC: 32.1 g/dL (ref 30.0–36.0)
MCV: 99 fL (ref 80.0–100.0)
Platelets: 255 10*3/uL (ref 150–400)
RBC: 3.93 MIL/uL (ref 3.87–5.11)
RDW: 14.2 % (ref 11.5–15.5)
WBC: 5.6 10*3/uL (ref 4.0–10.5)
nRBC: 0 % (ref 0.0–0.2)

## 2020-01-12 NOTE — H&P (Signed)
Patient:Veronica Francis, [place] DOB: 1975-12-09 Age: 44 Y Sex: Female Scribe  Phone: [phone] Primary Insurance: [facility] [place] [employer] MEDICARE Payer ID: [id number] Address: [address] [place] Account Number: [id number] PCP: [facility] [id number] Encounter Date: 12/26/2019 Provider: Christophe Louis, MD Appointment [facility]: [facility]   Subjective: Chief Complaint(s):   Abnormal uterine bleeding and dyspareunia/ Endo BX/pre op   HPI:  Isolation Precautions 1. Is fever present / reported?: No, 2. Are respiratory illness symptom(s) present / reported?: No, 3. Are other symptom(s) present / reported?: No, 5. Has there been reported travel to a High Risk respiratory illness region?: No, 6. Has close* contact with person(s) known to have communicable illness been reported?: No, 7. Did travel or close contact (if applicable) occur within 14 days of symptom onset?: No" label="Respiratory Illness Screening" propId="[id number]" catId="[id number]" encId="[id number]"Respiratory Illness Screening 1. Is fever present / reported? No, 2. Are respiratory illness symptom(s) present / reported? No, 3. Are other symptom(s) present / reported? No, 5. Has there been reported travel to a High Risk respiratory illness region? No, 6. Has close* contact with person(s) known to have communicable illness been reported? No, 7. Did travel or close contact (if applicable) occur within 14 days of symptom onset? No.  General 44 yo presents for pre-op visit and EMB. Pt is scheduled for robotic assisted laparoscopic hysterectomy with bilateral salpingectomy on Jan 14, 2020. Pt was last seen on Oct 20, 2019 for irregular menses, intermenstrual bleeding, and dyspareunia. She had a pelvic U/S which revealed uterus measuring 11.6 x 7.7 x 9.3 cm. Endometrium measured 1.02 cm. 11 fibroids were measured, with the largest measuring 4.1 cm, submucosal measuring 2.7 cm and pushing on lining. LT OV contains 2.0 cm cyst, avascular  and suspect dermoid. RT OV not visualized. Pt was recommended EMB for evaluation of irregular menses.  She was recommended hysterectomy versus uterine artery embolization for management of dyspareunia due to fibroids. She elected to proceed with hysterectomy. Pelvic examination revealed 12 week size uterus. Otherwise normal. EMB performed in office today. Current Medication: Taking Anusol-HC(Hydrocortisone Acetate) 2.5 % Cream 1 application to affected area Rectal Twice a day. CarBAMazepine ER 100 MG Capsule Extended Release 12 Hour 3 capsules in the morning and 3 capsules in the evening Orally Twice a day. Ferrous Sulfate 325 (65 Fe) MG Tablet 1 tablet Orally Once a day. Fexofenadine HCl 180 MG Tablet 1 tablet as needed Orally Once a day. Fluoxetine HCl 10 MG Capsule 1 tablet Orally twice a day. Vimpat(Lacosamide) 100 MG Tablet 1 tablet Orally Twice a day. Linzess 63mcg . Capsules 1 Capsule Orally Once Daily with First Meal, Notes: prn. Escitalopram Oxalate 10 MG Tablet 1 tablet Orally Once a day. Not-Taking Amitiza(Lubiprostone) 8 MCG Capsule 1 capsule with food Orally Twice a day. Medication List reviewed and reconciled with the patient. Medical History:  Hx of seizure disorder - stimulator ... as of 12/26/2019... last seizure March 2020     Depression     Genital Herpes     Blood transfusion in 2017     HTN     Vital nerve stimulator to help with seizures      Allergies/Intolerance: Morphine Sulfate: Allergy - tachycardia  spruce pollen : Allergy  Gyn History:  Sexual activity currently sexually active. Periods : irregular. LMP 12/2019. Birth control BTL. Last pap smear date 2020- per pt - normal . Last mammogram date 2020 - per pt - normal . STD Herpes. GYN procedures  hysterectomy. par.   OB History:  Number of pregnancies 4. Pregnancy # 1 live birth, C-section delivery, girl. Pregnancy # 2 live birth, C-section, boy. Pregnancy # 3 live birth, C-section, boy. Pregnancy # 4:  pregnant after Tubal Liga.   Surgical History:  Fracture     Hip surgery     Knee Surgery     Colonoscopy 2018     foot surgery 12/2018     right salpingectomy 2004     cesarean section x3 1994/1995/1999   Hospitalization:  see above   Family History:  Father: alive    Mother: deceased, diagnosed with Ovarian cancer    2 son(s) , 1 daughter(s) .    No Family History of Colon Cancer, Polyps, or Liver Disease.  Social History: General Tobacco use cigarettes: Current smoker, Frequency: 5 cigarettes per day, Tobacco history last updated 12/26/2019.  no EXPOSURE TO PASSIVE SMOKE.  Alcohol: yes, occasionally.  Recreational drug use: yes, Marijuana.  Marital Status: married.  Children: 2, Boys, 1, girls.  OCCUPATION: unemployed, disabled.   ROS: CONSTITUTIONAL No" label="Chills" value="" options="no,yes" propid="91" itemid="193425" categoryid="10464" encounterid="[id number]"Chills No. Fatigue YES. No" label="Fever" value="" options="no,yes" propid="91" itemid="10467" categoryid="10464" encounterid="[id number]"Fever No. Night sweats YES. No" label="Recent travel outside [place]" value="" options="no,yes" propid="91" itemid="444261" categoryid="10464" encounterid="[id number]"Recent travel outside [place] No. No" label="Sweats" value="" options="no,yes" propid="91" itemid="193427" categoryid="10464" encounterid="12193435"Sweats No. No" label="Weight change" value="" options="no,yes" propid="91" itemid="194825" categoryid="10464" encounterid="12193435"Weight change No.  OPHTHALMOLOGY no" label="Blurring of vision" value="" options="no,yes" propid="91" itemid="12520" categoryid="12516" encounterid="12193435"Blurring of vision no. no" label="Change in vision" value="" options="no,yes" propid="91" itemid="193469" categoryid="12516" encounterid="12193435"Change in vision no. no" label="Double vision" value="" options="no,yes" propid="91" itemid="194379" categoryid="12516" encounterid="12193435"Double vision  no.  ENT no" label="Dizziness" value="" options="no,yes" propid="91" itemid="193612" categoryid="10481" encounterid="12193435"Dizziness no. Nose bleeds no. Sore throat no. Teeth pain no.  ALLERGY no" label="Hives" value="" options="no,yes" propid="91" itemid="202589" categoryid="138152" encounterid="12193435"Hives no.  CARDIOLOGY no" label="Chest pain" value="" options="no,yes" propid="91" itemid="193603" categoryid="10488" encounterid="12193435"Chest pain no. no" label="High blood pressure" value="" options="no,yes" propid="91" itemid="199089" categoryid="10488" encounterid="12193435"High blood pressure no. no" label="Irregular heart beat" value="" options="no,yes" propid="91" itemid="202598" categoryid="10488" encounterid="12193435"Irregular heart beat no. Leg edema YES. no" label="Palpitations" value="" options="no,yes" propid="91" itemid="10490" categoryid="10488" encounterid="12193435"Palpitations no.  RESPIRATORY no" label="Shortness of breath" value="" options="no" propid="91" itemid="270013" categoryid="138132" encounterid="12193435"Shortness of breath no. no" label="Cough" value="" options="no,yes" propid="91" itemid="172745" categoryid="138132" encounterid="12193435"Cough no. no" label="Wheezing" value="" options="no,yes" propid="91" itemid="193621" categoryid="138132" encounterid="12193435"Wheezing no.  UROLOGY no" label="Pain with urination" value="" options="no,yes" propid="91" itemid="194377" categoryid="138166" encounterid="12193435"Pain with urination no. no" label="Urinary urgency" value="" options="no,yes" propid="91" itemid="193493" categoryid="138166" encounterid="12193435"Urinary urgency no. no" label="Urinary frequency" value="" options="no,yes" propid="91" itemid="193492" categoryid="138166" encounterid="12193435"Urinary frequency no. no" label="Urinary incontinence" value="" options="no,yes" propid="91" itemid="138171" categoryid="138166" encounterid="12193435"Urinary incontinence  no. No" label="Difficulty urinating" value="" options="no,yes" propid="91" itemid="138167" categoryid="138166" encounterid="12193435"Difficulty urinating No. No" label="Blood in urine" value="" options="no,yes" propid="91" itemid="138168" categoryid="138166" encounterid="12193435"Blood in urine No.  GASTROENTEROLOGY no" label="Abdominal pain" value="" options="no,yes" propid="91" itemid="10496" categoryid="10494" encounterid="12193435"Abdominal pain no. no" label="Appetite change" value="" options="no,yes" propid="91" itemid="193447" categoryid="10494" encounterid="12193435"Appetite change no. no" label="Bloating/belching" value="" options="no,yes" propid="91" itemid="193448" categoryid="10494" encounterid="12193435"Bloating/belching no. no" label="Blood in stool or on toilet paper" value="" options="no,yes" propid="91" itemid="10503" categoryid="10494" encounterid="12193435"Blood in stool or on toilet paper no. no" label="Change in bowel movements" value="" options="no,yes" propid="91" itemid="199106" categoryid="10494" encounterid="12193435"Change in bowel movements no. no" label="Constipation" value="" options="no,yes" propid="91" itemid="10501" categoryid="10494" encounterid="12193435"Constipation no. no" label="Diarrhea" value="" options="no,yes" propid="91" itemid="10502" categoryid="10494" encounterid="12193435"Diarrhea no. no" label="Difficulty swallowing" value="" options="no,yes" propid="91" itemid="199104" categoryid="10494" encounterid="12193435"Difficulty swallowing no. no" label="Nausea" value="" options="no,yes" propid="91" itemid="10499" categoryid="10494" encounterid="12193435"Nausea no.  FEMALE REPRODUCTIVE no" label="Vulvar pain" value="" options="no,yes" propid="91" itemid="453725" categoryid="10525" encounterid="12193435"Vulvar pain no. no" label="Vulvar rash" value="" options="no,yes" propid="91" itemid="453726" categoryid="10525" encounterid="12193435"Vulvar  rash no. yes" label="Abnormal  vaginal bleeding" value="" options="no, yes" propid="91" itemid="444315" categoryid="10525" encounterid="12193435"Abnormal vaginal bleeding yes. no" label="Breast pain" value="" options="no,yes" propid="91" itemid="186083" categoryid="10525" encounterid="12193435"Breast pain no. no" label="Nipple discharge" value="" options="no,yes" propid="91" itemid="186084" categoryid="10525" encounterid="12193435"Nipple discharge no. Pain with intercourse YES. no" label="Pelvic pain" value="" options="no,yes" propid="91" itemid="186082" categoryid="10525" encounterid="12193435"Pelvic pain no. no" label="Unusual vaginal discharge" value="" options="no,yes" propid="91" itemid="278230" categoryid="10525" encounterid="12193435"Unusual vaginal discharge no. no" label="Vaginal itching" value="" options="no,yes" propid="91" itemid="278942" categoryid="10525" encounterid="12193435"Vaginal itching no.  MUSCULOSKELETAL no" label="Muscle aches" value="" options="no,yes" propid="91" itemid="193461" categoryid="10514" encounterid="12193435"Muscle aches no.  NEUROLOGY no" label="Headache" value="" options="no,yes" propid="91" itemid="12513" categoryid="12512" encounterid="12193435"Headache no. no" label="Tingling/numbness" value="" options="no,yes" propid="91" itemid="12514" categoryid="12512" encounterid="12193435"Tingling/numbness no. no" label="Weakness" value="" options="no,yes" propid="91" itemid="193468" categoryid="12512" encounterid="12193435"Weakness no.  PSYCHOLOGY Depression YES. Anxiety YES. no" label="Nervousness" value="" options="no,yes" propid="91" itemid="199158" categoryid="10520" encounterid="12193435"Nervousness no. Sleep disturbances YES - trouble sleeping. no " label="Suicidal ideation" value="" options="no,yes" propid="91" itemid="72718" categoryid="10520" encounterid="12193435"Suicidal ideation no .  ENDOCRINOLOGY Excessive thirst no. Excessive urination YES. no" label="Hair loss" value="" options="no, yes"  propid="91" itemid="444314" categoryid="12508" encounterid="12193435"Hair loss no. no" label="Heat or cold intolerance" value="" options="" propid="91" itemid="447284" categoryid="12508" encounterid="12193435"Heat or cold intolerance no.  HEMATOLOGY/LYMPH no" label="Abnormal bleeding" value="" options="no,yes" propid="91" itemid="199152" categoryid="138157" encounterid="12193435"Abnormal bleeding no. no" label="Easy bruising" value="" options="no,yes" propid="91" itemid="170653" categoryid="138157" encounterid="12193435"Easy bruising no. no" label="Swollen glands" value="" options="no,yes" propid="91" itemid="138158" categoryid="138157" encounterid="12193435"Swollen glands no.  DERMATOLOGY no" label="New/changing skin lesion" value="" options="no,yes" propid="91" itemid="199126" categoryid="12503" encounterid="12193435"New/changing skin lesion no. no" label="Rash" value="" options="no,yes" propid="91" itemid="12504" categoryid="12503" encounterid="12193435"Rash no. no" label="Sores" value="" options="" propid="91" itemid="444313" categoryid="12503" encounterid="12193435"Sores no.   Negative except as stated in HPI.  Objective: Vitals: Wt 187, Wt change -.8 lb, Ht 62, BMI 34.20, Temp 97.2, Pulse sitting 74, BP sitting 110/72.  Past Results: Examination:  General Examination alert, oriented, NAD " label="CONSTITUTIONAL:" categoryPropId="10089" examid="193638"CONSTITUTIONAL: alert, oriented, NAD .  moist, warm" label="SKIN:" categoryPropId="10109" examid="193638"SKIN: moist, warm.  Conjunctiva clear" label="EYES:" categoryPropId="21468" examid="193638"EYES: Conjunctiva clear.  clear to auscultation bilaterally" label="LUNGS:" categoryPropId="87" examid="193638"LUNGS: clear to auscultation bilaterally.  regular rate and rhythm" label="HEART:" categoryPropId="86" examid="193638"HEART: regular rate and rhythm.  soft, non-tender/non-distended, bowel sounds present " label="ABDOMEN:" categoryPropId="88"  examid="193638"ABDOMEN: soft, non-tender/non-distended, bowel sounds present .  FEMALE GENITOURINARY: normal external genitalia, labia - unremarkable, vagina - pink moist mucosa, no lesions or abnormal discharge, cervix - no discharge or lesions or CMT, adnexa - no masses or tenderness, uterus - 12 week size uterus.  no edema present" label="EXTREMITIES:" categoryPropId="89" examid="193638"EXTREMITIES: no edema present.  affect normal, good eye contact" label="PSYCH:" categoryPropId="16316" examid="193638"PSYCH: affect normal, good eye contact.  Physical Examination: Chaperone present for pelvic exam, Chapman,Courtney 12/26/2019 11:20:39 AM &gt; " label="Chaperone present" itemId="278390" categoryId="[id number]"Chaperone present for pelvic exam, Chapman,Courtney 12/26/2019 11:20:39 AM > .   Pt aware of scribe services today.   Assessment: Assessment:  Fibroids - D25.9 (Primary)     Menorrhagia with irregular cycle - N92.1     Plan: Treatment: Fibroids Lab:[facility]  Notes: Pt is scheduled for robotic assisted laparoscopic hysterectomy with bilateral salpingectomy on Jan 14, 2020. Discussed w/ pt risks, benefits, and alternatives of hysterectomy including but not limited to infection/bleeding, larger incision to abdomen, damage to bowel, bladder, ureters and surrounding organs with the need for further surgery. Discussed risk of blood transfusion. Discussed risk of HIV/hep B&C with blood transfusion. Patient is aware of risks. Pt will be staying in hospital for one night. If larger incision is needed, pt will remain in hospital longer. In order to go home, pt must tolerate foods and take pain medication p.o., stand and walk around, and must be able to  urinate. Advised pt to avoid driving for 1 wk following procedure and avoid lifting weight greater than 10 lbs for 6-8 weeks or intercourse for 6-8 weeks. Advised pt to avoid drinking anything midnight prior to surgery. Pt to avoid NSAIDs (Aleve,  Ibuprofen, Advil) prior to surgery. She may take Tylenol for pain management. Plan for 2 wk post-op visit after surgery.. Menorrhagia with irregular cycle Procedure:GYN ENDOMETRIAL BIOPSY Notes: Pt is scheduled for robotic assisted laparoscopic hysterectomy with bilateral salpingectomy on Jan 14, 2020.

## 2020-01-12 NOTE — Progress Notes (Signed)
PCP - [facility] in [place].  Cardiologist -   Chest x-ray -  EKG -  Stress Test -  ECHO -  Cardiac Cath -   Sleep Study - Yes CPAP - Yes  Fasting Blood Sugar -  Checks Blood Sugar _____ times a day  Blood Thinner Instructions: Aspirin Instructions: Last Dose:  Anesthesia review: Pt. Verbalized her last seizure episode was on 02/2019. She's currently taking her medication for it. RN recommended to bring CPAP mask and tube,but pt. Said she prefers to use the one from the hospital.Pt. also was advised to stop smoking cigarettes and using marihuana at least 24 hour prior surgery.Pt. verbalized her understanding of those instructions.  Patient denies shortness of breath, fever, cough and chest pain at PAT appointment   Patient verbalized understanding of instructions that were given to them at the PAT appointment. Patient was also instructed that they will need to review over the PAT instructions again at home before surgery.

## 2020-01-14 ENCOUNTER — Encounter (HOSPITAL_BASED_OUTPATIENT_CLINIC_OR_DEPARTMENT_OTHER): Payer: Self-pay | Admitting: Obstetrics and Gynecology

## 2020-01-14 ENCOUNTER — Observation Stay (HOSPITAL_BASED_OUTPATIENT_CLINIC_OR_DEPARTMENT_OTHER)
Admission: RE | Admit: 2020-01-14 | Discharge: 2020-01-15 | Disposition: A | Discharge status: Home / Self Care | Payer: Medicare HMO | Attending: Obstetrics and Gynecology | Admitting: Obstetrics and Gynecology

## 2020-01-14 ENCOUNTER — Ambulatory Visit (HOSPITAL_BASED_OUTPATIENT_CLINIC_OR_DEPARTMENT_OTHER): Payer: Medicare HMO | Admitting: Physician Assistant

## 2020-01-14 ENCOUNTER — Ambulatory Visit (HOSPITAL_BASED_OUTPATIENT_CLINIC_OR_DEPARTMENT_OTHER): Payer: Medicare HMO | Admitting: Anesthesiology

## 2020-01-14 ENCOUNTER — Encounter (HOSPITAL_BASED_OUTPATIENT_CLINIC_OR_DEPARTMENT_OTHER)
Admission: RE | Disposition: A | Discharge status: Home / Self Care | Payer: Self-pay | Source: Home / Self Care | Attending: Obstetrics and Gynecology

## 2020-01-14 ENCOUNTER — Other Ambulatory Visit: Payer: Self-pay

## 2020-01-14 DIAGNOSIS — N72 Inflammatory disease of cervix uteri: Secondary | ICD-10-CM | POA: Diagnosis not present

## 2020-01-14 DIAGNOSIS — Z885 Allergy status to narcotic agent status: Secondary | ICD-10-CM | POA: Diagnosis not present

## 2020-01-14 DIAGNOSIS — F319 Bipolar disorder, unspecified: Secondary | ICD-10-CM | POA: Diagnosis not present

## 2020-01-14 DIAGNOSIS — D259 Leiomyoma of uterus, unspecified: Secondary | ICD-10-CM | POA: Diagnosis present

## 2020-01-14 DIAGNOSIS — F419 Anxiety disorder, unspecified: Secondary | ICD-10-CM | POA: Diagnosis not present

## 2020-01-14 DIAGNOSIS — D251 Intramural leiomyoma of uterus: Secondary | ICD-10-CM | POA: Diagnosis not present

## 2020-01-14 DIAGNOSIS — I10 Essential (primary) hypertension: Secondary | ICD-10-CM | POA: Insufficient documentation

## 2020-01-14 DIAGNOSIS — G40909 Epilepsy, unspecified, not intractable, without status epilepticus: Secondary | ICD-10-CM | POA: Insufficient documentation

## 2020-01-14 DIAGNOSIS — Z79899 Other long term (current) drug therapy: Secondary | ICD-10-CM | POA: Diagnosis not present

## 2020-01-14 DIAGNOSIS — N939 Abnormal uterine and vaginal bleeding, unspecified: Secondary | ICD-10-CM | POA: Diagnosis not present

## 2020-01-14 DIAGNOSIS — N888 Other specified noninflammatory disorders of cervix uteri: Secondary | ICD-10-CM | POA: Diagnosis not present

## 2020-01-14 DIAGNOSIS — F17210 Nicotine dependence, cigarettes, uncomplicated: Secondary | ICD-10-CM | POA: Insufficient documentation

## 2020-01-14 DIAGNOSIS — G4730 Sleep apnea, unspecified: Secondary | ICD-10-CM | POA: Insufficient documentation

## 2020-01-14 DIAGNOSIS — Z90710 Acquired absence of both cervix and uterus: Secondary | ICD-10-CM | POA: Diagnosis present

## 2020-01-14 HISTORY — PX: ROBOTIC ASSISTED LAPAROSCOPIC HYSTERECTOMY AND SALPINGECTOMY: SHX6379

## 2020-01-14 LAB — TYPE AND SCREEN
ABO/RH(D): O POS
Antibody Screen: NEGATIVE

## 2020-01-14 LAB — POCT PREGNANCY, URINE: Preg Test, Ur: NEGATIVE

## 2020-01-14 SURGERY — XI ROBOTIC ASSISTED LAPAROSCOPIC HYSTERECTOMY AND SALPINGECTOMY
Anesthesia: General | Site: Abdomen | Laterality: Left

## 2020-01-14 MED ORDER — LIDOCAINE 2% (20 MG/ML) 5 ML SYRINGE
INTRAMUSCULAR | Status: AC
  Filled 2020-01-14: qty 5

## 2020-01-14 MED ORDER — IBUPROFEN 800 MG PO TABS
800.0000 mg | ORAL_TABLET | Freq: Three times a day (TID) | ORAL | Status: DC
  Administered 2020-01-14 – 2020-01-15 (×2): 800 mg via ORAL
  Filled 2020-01-14: qty 1

## 2020-01-14 MED ORDER — SODIUM CHLORIDE 0.9 % IV SOLN
2.0000 g | INTRAVENOUS | Status: AC
  Administered 2020-01-14: 09:00:00 2 g via INTRAVENOUS
  Filled 2020-01-14: qty 2

## 2020-01-14 MED ORDER — SODIUM CHLORIDE (PF) 0.9 % IJ SOLN
INTRAMUSCULAR | Status: AC
  Filled 2020-01-14: qty 10

## 2020-01-14 MED ORDER — METHYLENE BLUE 0.5 % INJ SOLN
INTRAVENOUS | Status: DC | PRN
  Administered 2020-01-14: 3 mL via SUBMUCOSAL

## 2020-01-14 MED ORDER — SENNA 8.6 MG PO TABS
1.0000 | ORAL_TABLET | Freq: Two times a day (BID) | ORAL | Status: DC
  Administered 2020-01-14: 22:00:00 8.6 mg via ORAL
  Filled 2020-01-14: qty 1

## 2020-01-14 MED ORDER — LACTATED RINGERS IV SOLN
INTRAVENOUS | Status: DC
  Filled 2020-01-14: qty 1000

## 2020-01-14 MED ORDER — SIMETHICONE 80 MG PO CHEW
CHEWABLE_TABLET | ORAL | Status: AC
  Filled 2020-01-14: qty 1

## 2020-01-14 MED ORDER — KETOROLAC TROMETHAMINE 30 MG/ML IJ SOLN
INTRAMUSCULAR | Status: AC
  Filled 2020-01-14: qty 1

## 2020-01-14 MED ORDER — OXYCODONE HCL 5 MG PO TABS
ORAL_TABLET | ORAL | Status: AC
  Filled 2020-01-14: qty 1

## 2020-01-14 MED ORDER — KETOROLAC TROMETHAMINE 30 MG/ML IJ SOLN
30.0000 mg | Freq: Once | INTRAMUSCULAR | Status: AC
  Administered 2020-01-14: 13:00:00 30 mg via INTRAVENOUS
  Filled 2020-01-14: qty 1

## 2020-01-14 MED ORDER — ONDANSETRON HCL 4 MG/2ML IJ SOLN
INTRAMUSCULAR | Status: DC | PRN
  Administered 2020-01-14: 4 mg via INTRAVENOUS

## 2020-01-14 MED ORDER — HYDROMORPHONE HCL 1 MG/ML IJ SOLN
0.2000 mg | INTRAMUSCULAR | Status: DC | PRN
  Administered 2020-01-14: 0.5 mg via INTRAVENOUS
  Filled 2020-01-14: qty 1

## 2020-01-14 MED ORDER — MENTHOL 3 MG MT LOZG
1.0000 | LOZENGE | OROMUCOSAL | Status: DC | PRN
  Filled 2020-01-14: qty 9

## 2020-01-14 MED ORDER — ONDANSETRON HCL 4 MG/2ML IJ SOLN
4.0000 mg | Freq: Four times a day (QID) | INTRAMUSCULAR | Status: DC | PRN
  Filled 2020-01-14: qty 2

## 2020-01-14 MED ORDER — MIDAZOLAM HCL 2 MG/2ML IJ SOLN
INTRAMUSCULAR | Status: DC | PRN
  Administered 2020-01-14 (×2): 1 mg via INTRAVENOUS
  Administered 2020-01-14: 2 mg via INTRAVENOUS

## 2020-01-14 MED ORDER — ACETAMINOPHEN 500 MG PO TABS
1000.0000 mg | ORAL_TABLET | ORAL | Status: AC
  Administered 2020-01-14: 08:00:00 1000 mg via ORAL
  Filled 2020-01-14: qty 2

## 2020-01-14 MED ORDER — CARBAMAZEPINE ER 100 MG PO TB12
300.0000 mg | ORAL_TABLET | Freq: Two times a day (BID) | ORAL | Status: DC
  Administered 2020-01-14: 22:00:00 300 mg via ORAL
  Filled 2020-01-14: qty 3

## 2020-01-14 MED ORDER — HYDROMORPHONE HCL 1 MG/ML IJ SOLN
INTRAMUSCULAR | Status: AC
  Filled 2020-01-14: qty 1

## 2020-01-14 MED ORDER — ACETAMINOPHEN 500 MG PO TABS
ORAL_TABLET | ORAL | Status: AC
  Filled 2020-01-14: qty 2

## 2020-01-14 MED ORDER — IBUPROFEN 800 MG PO TABS
ORAL_TABLET | ORAL | Status: AC
  Filled 2020-01-14: qty 1

## 2020-01-14 MED ORDER — FENTANYL CITRATE (PF) 100 MCG/2ML IJ SOLN
INTRAMUSCULAR | Status: AC
  Filled 2020-01-14: qty 2

## 2020-01-14 MED ORDER — PANTOPRAZOLE SODIUM 40 MG PO TBEC
40.0000 mg | DELAYED_RELEASE_TABLET | Freq: Every day | ORAL | Status: DC
  Administered 2020-01-14: 20:00:00 40 mg via ORAL
  Filled 2020-01-14: qty 1

## 2020-01-14 MED ORDER — SODIUM CHLORIDE 0.9 % IV SOLN
INTRAVENOUS | Status: AC
  Filled 2020-01-14: qty 2

## 2020-01-14 MED ORDER — ROCURONIUM BROMIDE 10 MG/ML (PF) SYRINGE
PREFILLED_SYRINGE | INTRAVENOUS | Status: AC
  Filled 2020-01-14: qty 10

## 2020-01-14 MED ORDER — ACETAMINOPHEN 500 MG PO TABS
1000.0000 mg | ORAL_TABLET | Freq: Three times a day (TID) | ORAL | Status: DC
  Administered 2020-01-14 – 2020-01-15 (×3): 1000 mg via ORAL
  Filled 2020-01-14: qty 2

## 2020-01-14 MED ORDER — ZOLPIDEM TARTRATE 5 MG PO TABS
5.0000 mg | ORAL_TABLET | Freq: Every evening | ORAL | Status: DC | PRN
  Filled 2020-01-14: qty 1

## 2020-01-14 MED ORDER — SIMETHICONE 80 MG PO CHEW
80.0000 mg | CHEWABLE_TABLET | Freq: Four times a day (QID) | ORAL | Status: DC | PRN
  Administered 2020-01-14: 80 mg via ORAL
  Filled 2020-01-14: qty 1

## 2020-01-14 MED ORDER — OXYCODONE HCL 5 MG PO TABS
5.0000 mg | ORAL_TABLET | ORAL | Status: DC | PRN
  Administered 2020-01-14 – 2020-01-15 (×4): 5 mg via ORAL
  Administered 2020-01-15: 10 mg via ORAL
  Filled 2020-01-14: qty 2

## 2020-01-14 MED ORDER — DEXAMETHASONE SODIUM PHOSPHATE 10 MG/ML IJ SOLN
INTRAMUSCULAR | Status: DC | PRN
  Administered 2020-01-14: 5 mg via INTRAVENOUS

## 2020-01-14 MED ORDER — LACOSAMIDE 50 MG PO TABS
100.0000 mg | ORAL_TABLET | Freq: Two times a day (BID) | ORAL | Status: DC
  Administered 2020-01-14: 100 mg via ORAL
  Filled 2020-01-14: qty 2

## 2020-01-14 MED ORDER — ALBUTEROL SULFATE HFA 108 (90 BASE) MCG/ACT IN AERS
1.0000 | INHALATION_SPRAY | Freq: Four times a day (QID) | RESPIRATORY_TRACT | Status: DC | PRN
  Filled 2020-01-14: qty 6.7

## 2020-01-14 MED ORDER — MIDAZOLAM HCL 2 MG/2ML IJ SOLN
INTRAMUSCULAR | Status: AC
  Filled 2020-01-14: qty 2

## 2020-01-14 MED ORDER — LACTATED RINGERS IV SOLN: INTRAVENOUS | Status: DC | PRN

## 2020-01-14 MED ORDER — ROCURONIUM BROMIDE 10 MG/ML (PF) SYRINGE
PREFILLED_SYRINGE | INTRAVENOUS | Status: DC | PRN
  Administered 2020-01-14: 10 mg via INTRAVENOUS
  Administered 2020-01-14: 20 mg via INTRAVENOUS
  Administered 2020-01-14: 80 mg via INTRAVENOUS

## 2020-01-14 MED ORDER — FLUOXETINE HCL 10 MG PO CAPS
10.0000 mg | ORAL_CAPSULE | Freq: Two times a day (BID) | ORAL | Status: DC
  Filled 2020-01-14: qty 1

## 2020-01-14 MED ORDER — LIDOCAINE 2% (20 MG/ML) 5 ML SYRINGE
INTRAMUSCULAR | Status: AC
  Filled 2020-01-14: qty 10

## 2020-01-14 MED ORDER — LIDOCAINE 2% (20 MG/ML) 5 ML SYRINGE
INTRAMUSCULAR | Status: DC | PRN
  Administered 2020-01-14: 75 mg via INTRAVENOUS

## 2020-01-14 MED ORDER — SUCCINYLCHOLINE CHLORIDE 200 MG/10ML IV SOSY
PREFILLED_SYRINGE | INTRAVENOUS | Status: AC
  Filled 2020-01-14: qty 10

## 2020-01-14 MED ORDER — ONDANSETRON HCL 4 MG PO TABS
4.0000 mg | ORAL_TABLET | Freq: Four times a day (QID) | ORAL | Status: DC | PRN
  Filled 2020-01-14: qty 1

## 2020-01-14 MED ORDER — PANTOPRAZOLE SODIUM 40 MG PO TBEC
DELAYED_RELEASE_TABLET | ORAL | Status: AC
  Filled 2020-01-14: qty 1

## 2020-01-14 MED ORDER — LACOSAMIDE 100 MG PO TABS: 100.0000 mg | ORAL_TABLET | Freq: Two times a day (BID) | ORAL | Status: DC

## 2020-01-14 MED ORDER — SODIUM CHLORIDE 0.9 % IV SOLN
INTRAVENOUS | Status: DC | PRN
  Administered 2020-01-14: 12:00:00 105 mL

## 2020-01-14 MED ORDER — LIDOCAINE 20MG/ML (2%) 15 ML SYRINGE OPTIME
INTRAMUSCULAR | Status: DC | PRN
  Administered 2020-01-14: 1.5 mg/kg/h via INTRAVENOUS

## 2020-01-14 MED ORDER — CELECOXIB 200 MG PO CAPS
ORAL_CAPSULE | ORAL | Status: AC
  Filled 2020-01-14: qty 2

## 2020-01-14 MED ORDER — DEXAMETHASONE SODIUM PHOSPHATE 10 MG/ML IJ SOLN
INTRAMUSCULAR | Status: AC
  Filled 2020-01-14: qty 1

## 2020-01-14 MED ORDER — CARBAMAZEPINE ER 100 MG PO CP12: 300.0000 mg | ORAL_CAPSULE | Freq: Two times a day (BID) | ORAL | Status: DC

## 2020-01-14 MED ORDER — PROPOFOL 10 MG/ML IV BOLUS
INTRAVENOUS | Status: DC | PRN
  Administered 2020-01-14: 40 mg via INTRAVENOUS
  Administered 2020-01-14: 30 mg via INTRAVENOUS
  Administered 2020-01-14: 100 mg via INTRAVENOUS

## 2020-01-14 MED ORDER — FENTANYL CITRATE (PF) 250 MCG/5ML IJ SOLN
INTRAMUSCULAR | Status: DC | PRN
  Administered 2020-01-14 (×9): 50 ug via INTRAVENOUS

## 2020-01-14 MED ORDER — ONDANSETRON HCL 4 MG/2ML IJ SOLN
INTRAMUSCULAR | Status: AC
  Filled 2020-01-14: qty 2

## 2020-01-14 MED ORDER — PROPOFOL 10 MG/ML IV BOLUS
INTRAVENOUS | Status: AC
  Filled 2020-01-14: qty 40

## 2020-01-14 MED ORDER — FENTANYL CITRATE (PF) 100 MCG/2ML IJ SOLN
25.0000 ug | INTRAMUSCULAR | Status: DC | PRN
  Administered 2020-01-14 (×2): 25 ug via INTRAVENOUS
  Filled 2020-01-14: qty 1

## 2020-01-14 MED ORDER — ALUM & MAG HYDROXIDE-SIMETH 200-200-20 MG/5ML PO SUSP
30.0000 mL | ORAL | Status: DC | PRN
  Filled 2020-01-14: qty 30

## 2020-01-14 MED ORDER — FLUOXETINE HCL 10 MG PO CAPS
10.0000 mg | ORAL_CAPSULE | Freq: Two times a day (BID) | ORAL | Status: DC
  Administered 2020-01-14: 22:00:00 10 mg via ORAL
  Filled 2020-01-14: qty 1

## 2020-01-14 MED ORDER — CELECOXIB 400 MG PO CAPS
400.0000 mg | ORAL_CAPSULE | ORAL | Status: AC
  Administered 2020-01-14: 08:00:00 400 mg via ORAL
  Filled 2020-01-14: qty 1

## 2020-01-14 MED ORDER — SODIUM CHLORIDE 0.9 % IR SOLN
Status: DC | PRN
  Administered 2020-01-14: 200 mL via INTRAVESICAL

## 2020-01-14 MED ORDER — SENNA 8.6 MG PO TABS
ORAL_TABLET | ORAL | Status: AC
  Filled 2020-01-14: qty 1

## 2020-01-14 MED ORDER — FENTANYL CITRATE (PF) 250 MCG/5ML IJ SOLN
INTRAMUSCULAR | Status: AC
  Filled 2020-01-14: qty 5

## 2020-01-14 SURGICAL SUPPLY — 65 items
APPLICATOR ARISTA FLEXITIP XL (MISCELLANEOUS) ×3 IMPLANT
BARRIER ADHS 3X4 INTERCEED (GAUZE/BANDAGES/DRESSINGS) IMPLANT
BRR ADH 4X3 ABS CNTRL BYND (GAUZE/BANDAGES/DRESSINGS)
CATH FOLEY 3WAY  5CC 16FR (CATHETERS) ×2
CATH FOLEY 3WAY 5CC 16FR (CATHETERS) ×1 IMPLANT
COVER BACK TABLE 60X90IN (DRAPES) ×3 IMPLANT
COVER TIP SHEARS 8 DVNC (MISCELLANEOUS) ×1 IMPLANT
COVER TIP SHEARS 8MM DA VINCI (MISCELLANEOUS) ×2
DECANTER SPIKE VIAL GLASS SM (MISCELLANEOUS) IMPLANT
DEFOGGER SCOPE WARMER CLEARIFY (MISCELLANEOUS) ×3 IMPLANT
DERMABOND ADVANCED (GAUZE/BANDAGES/DRESSINGS) ×2
DERMABOND ADVANCED .7 DNX12 (GAUZE/BANDAGES/DRESSINGS) ×1 IMPLANT
DILATOR CANAL MILEX (MISCELLANEOUS) ×3 IMPLANT
DRAPE ARM DVNC X/XI (DISPOSABLE) ×4 IMPLANT
DRAPE COLUMN DVNC XI (DISPOSABLE) ×1 IMPLANT
DRAPE DA VINCI XI ARM (DISPOSABLE) ×8
DRAPE DA VINCI XI COLUMN (DISPOSABLE) ×2
DURAPREP 26ML APPLICATOR (WOUND CARE) ×3 IMPLANT
ELECT REM PT RETURN 9FT ADLT (ELECTROSURGICAL) ×3
ELECTRODE REM PT RTRN 9FT ADLT (ELECTROSURGICAL) ×1 IMPLANT
GAUZE SPONGE 4X4 16PLY XRAY LF (GAUZE/BANDAGES/DRESSINGS) ×3 IMPLANT
GLOVE BIO SURGEON STRL SZ7 (GLOVE) ×3 IMPLANT
GLOVE BIOGEL M 6.5 STRL (GLOVE) ×9 IMPLANT
GLOVE BIOGEL PI IND STRL 7.0 (GLOVE) ×6 IMPLANT
GLOVE BIOGEL PI INDICATOR 7.0 (GLOVE) ×12
HEMOSTAT ARISTA ABSORB 3G PWDR (HEMOSTASIS) ×3 IMPLANT
IRRIG SUCT STRYKERFLOW 2 WTIP (MISCELLANEOUS) ×3
IRRIGATION SUCT STRKRFLW 2 WTP (MISCELLANEOUS) ×1 IMPLANT
IV NS 1000ML (IV SOLUTION) ×2
IV NS 1000ML BAXH (IV SOLUTION) ×1 IMPLANT
IV NS IRRIG 3000ML ARTHROMATIC (IV SOLUTION) ×3 IMPLANT
LEGGING LITHOTOMY PAIR STRL (DRAPES) ×3 IMPLANT
OBTURATOR OPTICAL STANDARD 8MM (TROCAR)
OBTURATOR OPTICAL STND 8 DVNC (TROCAR)
OBTURATOR OPTICALSTD 8 DVNC (TROCAR) IMPLANT
OCCLUDER COLPOPNEUMO (BALLOONS) ×3 IMPLANT
PACK ROBOT WH (CUSTOM PROCEDURE TRAY) ×3 IMPLANT
PACK ROBOTIC GOWN (GOWN DISPOSABLE) ×3 IMPLANT
PACK TRENDGUARD 450 HYBRID PRO (MISCELLANEOUS) ×1 IMPLANT
PAD PREP 24X48 CUFFED NSTRL (MISCELLANEOUS) ×3 IMPLANT
PROTECTOR NERVE ULNAR (MISCELLANEOUS) ×3 IMPLANT
SEAL CANN UNIV 5-8 DVNC XI (MISCELLANEOUS) ×4 IMPLANT
SEAL XI 5MM-8MM UNIVERSAL (MISCELLANEOUS) ×8
SEALER VESSEL DA VINCI XI (MISCELLANEOUS)
SEALER VESSEL EXT DVNC XI (MISCELLANEOUS) IMPLANT
SET IRRIG Y TYPE TUR BLADDER L (SET/KITS/TRAYS/PACK) IMPLANT
SET TRI-LUMEN FLTR TB AIRSEAL (TUBING) ×3 IMPLANT
SLEEVE SURGEON STRL (DRAPES) ×3 IMPLANT
SUT VIC AB 0 CT1 27 (SUTURE) ×6
SUT VIC AB 0 CT1 27XBRD ANBCTR (SUTURE) ×3 IMPLANT
SUT VIC AB 3-0 CT1 27 (SUTURE) ×2
SUT VIC AB 3-0 CT1 TAPERPNT 27 (SUTURE) ×1 IMPLANT
SUT VICRYL 0 UR6 27IN ABS (SUTURE) IMPLANT
SUT VICRYL RAPIDE 4/0 PS 2 (SUTURE) ×9 IMPLANT
SUT VLOC 180 0 9IN  GS21 (SUTURE) ×6
SUT VLOC 180 0 9IN GS21 (SUTURE) ×3 IMPLANT
TIP RUMI ORANGE 6.7MMX12CM (TIP) IMPLANT
TIP UTERINE 5.1X6CM LAV DISP (MISCELLANEOUS) IMPLANT
TIP UTERINE 6.7X10CM GRN DISP (MISCELLANEOUS) IMPLANT
TIP UTERINE 6.7X6CM WHT DISP (MISCELLANEOUS) IMPLANT
TIP UTERINE 6.7X8CM BLUE DISP (MISCELLANEOUS) ×3 IMPLANT
TOWEL OR 17X26 10 PK STRL BLUE (TOWEL DISPOSABLE) ×6 IMPLANT
TRENDGUARD 450 HYBRID PRO PACK (MISCELLANEOUS) ×3
TROCAR PORT AIRSEAL 8X120 (TROCAR) ×3 IMPLANT
WATER STERILE IRR 1000ML POUR (IV SOLUTION) ×3 IMPLANT

## 2020-01-14 NOTE — Anesthesia Procedure Notes (Signed)
Procedure Name: Intubation Date/Time: 01/14/2020 8:45 AM Performed by: Lollie Sails, CRNA Pre-anesthesia Checklist: Patient identified, Emergency Drugs available, Suction available, Patient being monitored and Timeout performed Patient Re-evaluated:Patient Re-evaluated prior to induction Oxygen Delivery Method: Circle system utilized Preoxygenation: Pre-oxygenation with 100% oxygen Induction Type: IV induction Ventilation: Mask ventilation without difficulty Laryngoscope Size: Miller and 3 Grade View: Grade II Tube type: Oral Tube size: 7.0 mm Number of attempts: 1 Airway Equipment and Method: Stylet Placement Confirmation: ETT inserted through vocal cords under direct vision,  positive ETCO2 and breath sounds checked- equal and bilateral Secured at: 23 cm Tube secured with: Tape Dental Injury: Teeth and Oropharynx as per pre-operative assessment

## 2020-01-14 NOTE — Op Note (Signed)
01/14/2020  12:16 PM  PATIENT:  Veronica Francis  44 y.o. female  PRE-OPERATIVE DIAGNOSIS:  D25.9 Fibroids  POST-OPERATIVE DIAGNOSIS:  D25.9 Fibroids  PROCEDURE:  Procedure(s) with comments: XI ROBOTIC ASSISTED LAPAROSCOPIC HYSTERECTOMY AND LEFT SALPINGECTOMY (Left) - Tracie to RNFA confirmed on 01/06/20 CS  SURGEON:  Surgeon(s) and Role:    * Christophe Louis, MD - Primary  PHYSICIAN ASSISTANT:   ASSISTANTS: Tracie RNFA   ANESTHESIA:   general  EBL:  75 mL   BLOOD ADMINISTERED:none  DRAINS: Urinary Catheter (Foley)   LOCAL MEDICATIONS USED:  OTHER ropivicaine  SPECIMEN:  Source of Specimen:  Uterus cervix and bilateral fallopian tubes   DISPOSITION OF SPECIMEN:  PATHOLOGY  COUNTS:  YES  TOURNIQUET:  * No tourniquets in log *  DICTATION: .Dragon Dictation  PLAN OF CARE: Admit for overnight observation  PATIENT DISPOSITION:  PACU - hemodynamically stable.   Delay start of Pharmacological VTE agent (>24hrs) due to surgical blood loss or risk of bleeding: not applicable  Findings : Enlarged fibroid uterus / normal appearing ovaries/ the right fallopian tube was absent.Marland Kitchen left fallopian tube appeared normal. Normal external genitalia .Very narrow vaginal vault  Procedure: The patient was taken to the operating room where she was placed under general anesthesia.Time out was performed. Marland Kitchen She was placed in dorsal lithotomy position and prepped and draped in the usual sterile fashion. A weighted speculum was placed into the vagina. A Deaver was placed anteriorly for retraction. The anterior lip of the cervix was grasped with a single-tooth tenaculum. The vaginal mucosa was injected with 2.5 cc of ropivacaine at the 2/4/ 8 and 10 o'clock positions. The uterus was sounded to 8 cm. the cervix was dilated to 6 mm . 0 vicryl suture placed at the 12 and 6:00 positions Of the cervix to facilitate placement of a Ru mi uterine manipulator. The manipulator was placed without difficulty.  Weighted speculum and Deaver were removed .  Attention was turned to the patient's abdomen where a 8 mm trocar was placed 2 cm above the umbilicus. under direct visualization . The pneumoperitoneum was achieved with PCO2 gas. The laparoscope was removed. 60 cc of ropivacaine were injected into the abdominal cavity. The laparoscope was reinserted. An 8 mm trocar was placed in the right upper quadrant 16 centimeters from the umbilicus.later connected to robotic arm #4). An 8MM incision was made in the Right upper quadrant TROCAR WAS PLACED 8 cm from the umbilicus. Later connected to robotic arm #3. An 8 mm incision was made in the left upper quadrant 16 cm from the umbilicus and connected to robot arm #1. Marland Kitchen Attention was turned to the left upper quadrant where a 8 mm midclavicular assistant trocar was placed. ( All incision sites were injected with 10cc of ropivacaine prior to port placement. )  Once all ports had been placed under direct visualization.The laparoscope was removed and the [place] robotic system was thin right-sided docked. The robotic arms were connected to the corresponding trocars as listed above. The laparoscope was then reinserted. The long tip bipolar forceps were placed into port #1. The monopolar scissor placed in the port #4. A vessel sealerwas placed in port #3. All instruments were directed into the pelvis under direct visualization.  Attention was turned to the surgeons console.. The left mesosalpinx and left utero-ovarian ligament was cauterized and transected with the vessel sealer The broad ligament was cauterized and transected with the vessel sealer .The round ligament was cauterized and transected with  the vessel sealer  The anterior leaf of broad ligament was incised along the bladder reflection to the midline.  The  right utero-ovarian ligament was cauterized and transected with the vessel sealer. The right broad ligament was cauterized and transected with the vessel sealer. The  right round ligament was cauterized and transected with the vessel sealer The broad ligament was incised to the midline. The bladder was dissected off the lower uterine segments of the cervix via sharp and blunt dissection.   The uterine arteries were skeleton bilaterally. They were  cauterized and transected with the vessel sealer The KOH ring was identified. The anterior colpotomy was performed followed by the posterior colpotomy. Once the uterus,cervix and bilateral fallopian tubes were completely excised was removed through the vagina via manual morcellation. The  bipolar forceps and scissors were removed and log tip forceps were placed in the port #1 and the cutting needle driver was placed in to port #3.  The vaginal cuff was closed with running suture if 0 v-lock. The pelvis was irrigated. Marland KitchenMarland KitchenMarland KitchenExcellent hemostasis was noted. Arista was placed along the vaginal cuff.  All pelvic pedicles were examined and hemostasis was noted.  All instruments removed from the ports. All ports were removed under direct Visualization. The pneumoperitoneum was released. The skin incisions were closed with 4-0 Vicryl and then covered with Derma bond.   Pt was noted to have a 1 cm vaginal laceration that was reapproximated with 3-0 vicryl.     Sponge lap and needle counts weIre correct x. The patient was awakened from anesthesia and taken to the recovery room in stable condition.

## 2020-01-14 NOTE — Transfer of Care (Signed)
Immediate Anesthesia Transfer of Care Note  Patient: Veronica Francis  Procedure(s) Performed: XI ROBOTIC ASSISTED LAPAROSCOPIC HYSTERECTOMY AND LEFT SALPINGECTOMY (Left Abdomen)  Patient Location: PACU  Anesthesia Type:General  Level of Consciousness: drowsy and responds to stimulation  Airway & Oxygen Therapy: Patient Spontanous Breathing and Patient connected to face mask oxygen  Post-op Assessment: Report given to RN and Post -op Vital signs reviewed and stable  Post vital signs: Reviewed and stable  Last Vitals:  Vitals Value Taken Time  BP    Temp    Pulse 55 01/14/20 1227  Resp 14 01/14/20 1227  SpO2 100 % 01/14/20 1227  Vitals shown include unvalidated device data.  Last Pain:  Vitals:   01/14/20 0812  TempSrc: Oral  PainSc:       Patients Stated Pain Goal: 5 ([id number])  Complications: No apparent anesthesia complications

## 2020-01-14 NOTE — Anesthesia Preprocedure Evaluation (Addendum)
Anesthesia Evaluation  Patient identified by MRN, date of birth, ID band Patient awake    Reviewed: Allergy & Precautions, NPO status , Patient's Chart, lab work & pertinent test results  Airway Mallampati: II  TM Distance: >3 FB Neck ROM: Full    Dental  (+) Chipped, Dental Advisory Given,    Pulmonary sleep apnea (not compliant with CPAP) , Current Smoker and Patient abstained from smoking.,    Pulmonary exam normal breath sounds clear to auscultation       Cardiovascular hypertension, negative cardio ROS Normal cardiovascular exam Rhythm:Regular Rate:Normal     Neuro/Psych Seizures - (last seizure 02/2019, ususally has grand mal seizures, no airway compromise, compliant with anti-epileptics), Well Controlled,  PSYCHIATRIC DISORDERS Anxiety Depression Bipolar Disorder    GI/Hepatic negative GI ROS, Neg liver ROS,   Endo/Other  negative endocrine ROS  Renal/GU negative Renal ROS  negative genitourinary   Musculoskeletal negative musculoskeletal ROS (+)   Abdominal   Peds  Hematology negative hematology ROS (+)   Anesthesia Other Findings   Reproductive/Obstetrics                            Anesthesia Physical Anesthesia Plan  ASA: III  Anesthesia Plan: General   Post-op Pain Management:    Induction: Intravenous  PONV Risk Score and Plan: 2 and Midazolam, Dexamethasone and Ondansetron  Airway Management Planned: Oral ETT  Additional Equipment:   Intra-op Plan:   Post-operative Plan: Extubation in OR  Informed Consent: I have reviewed the patients History and Physical, chart, labs and discussed the procedure including the risks, benefits and alternatives for the proposed anesthesia with the patient or authorized representative who has indicated his/her understanding and acceptance.     Dental advisory given  Plan Discussed with: CRNA  Anesthesia Plan Comments:         Anesthesia Quick Evaluation

## 2020-01-14 NOTE — H&P (Signed)
Date of Initial H&P: 01/12/2020 History reviewed, patient examined, no change in status, stable for surgery.

## 2020-01-15 DIAGNOSIS — D251 Intramural leiomyoma of uterus: Secondary | ICD-10-CM | POA: Diagnosis not present

## 2020-01-15 LAB — CBC
HCT: 34.1 % — ABNORMAL LOW (ref 36.0–46.0)
Hemoglobin: 11 g/dL — ABNORMAL LOW (ref 12.0–15.0)
MCH: 31.6 pg (ref 26.0–34.0)
MCHC: 32.3 g/dL (ref 30.0–36.0)
MCV: 98 fL (ref 80.0–100.0)
Platelets: 205 10*3/uL (ref 150–400)
RBC: 3.48 MIL/uL — ABNORMAL LOW (ref 3.87–5.11)
RDW: 14 % (ref 11.5–15.5)
WBC: 8 10*3/uL (ref 4.0–10.5)
nRBC: 0 % (ref 0.0–0.2)

## 2020-01-15 LAB — SURGICAL PATHOLOGY

## 2020-01-15 MED ORDER — OXYCODONE HCL 5 MG PO TABS
ORAL_TABLET | ORAL | Status: AC
  Filled 2020-01-15: qty 1

## 2020-01-15 MED ORDER — IBUPROFEN 800 MG PO TABS
ORAL_TABLET | ORAL | Status: AC
  Filled 2020-01-15: qty 1

## 2020-01-15 MED ORDER — OXYCODONE HCL 5 MG PO TABS
ORAL_TABLET | ORAL | Status: AC
  Filled 2020-01-15: qty 2

## 2020-01-15 MED ORDER — ACETAMINOPHEN 500 MG PO TABS
ORAL_TABLET | ORAL | Status: AC
  Filled 2020-01-15: qty 2

## 2020-01-15 MED ORDER — IBUPROFEN 800 MG PO TABS: 800.0000 mg | ORAL_TABLET | Freq: Three times a day (TID) | ORAL | 1 refills | Status: AC | PRN

## 2020-01-15 MED ORDER — ACETAMINOPHEN 500 MG PO TABS: 1000.0000 mg | ORAL_TABLET | Freq: Three times a day (TID) | ORAL | 0 refills | Status: AC | PRN

## 2020-01-15 MED ORDER — OXYCODONE HCL 5 MG PO TABS: 5.0000 mg | ORAL_TABLET | Freq: Four times a day (QID) | ORAL | 0 refills | Status: DC | PRN

## 2020-01-15 NOTE — Anesthesia Postprocedure Evaluation (Signed)
Anesthesia Post Note  Patient: Veronica Francis  Procedure(s) Performed: XI ROBOTIC ASSISTED LAPAROSCOPIC HYSTERECTOMY AND LEFT SALPINGECTOMY (Left Abdomen)     Patient location during evaluation: PACU Anesthesia Type: General Level of consciousness: awake and alert Pain management: pain level controlled Vital Signs Assessment: post-procedure vital signs reviewed and stable Respiratory status: spontaneous breathing, nonlabored ventilation, respiratory function stable and patient connected to nasal cannula oxygen Cardiovascular status: blood pressure returned to baseline and stable Postop Assessment: no apparent nausea or vomiting Anesthetic complications: no    Last Vitals:  Vitals:   01/15/20 0514 01/15/20 0829  BP: 121/76 132/82  Pulse: 75 73  Resp: 20 16  Temp: 36.7 C 36.8 C  SpO2: 100% 100%    Last Pain:  Vitals:   01/15/20 0842  TempSrc:   PainSc: 2                  Izreal Kock L Leafy Motsinger

## 2020-01-15 NOTE — Discharge Summary (Signed)
Physician Discharge Summary  Patient ID: Veronica Francis MRN: [id number] DOB/AGE: May 22, 1976 44 y.o.  Admit date: 01/14/2020 Discharge date: 01/15/2020  Admission Diagnoses:Fibroids and abnormal uterine bleeding   Discharge Diagnoses:  Active Problems:   S/P laparoscopic hysterectomy   Discharged Condition: stable  Hospital Course: pt was admitted for observation after undergoing a robotic assisted laparoscopic hysterectomy with Bilateral salpingectomy. She did well postoperatively with return of bowel and bladder function. She is discharged on pod #1  Consults: None  Significant Diagnostic Studies: labs: 11.0 hgb   Treatments: surgery: robotic assisted laparoscopic hysterectomy with bilateral salpingectomy   Discharge Exam: Blood pressure 121/76, pulse 75, temperature 98.1 F (36.7 C), resp. rate 20, height 5\' 2"  (1.575 m), weight 84.7 kg, SpO2 100 %. General appearance: alert, cooperative and no distress GI: soft appropriately tender nondistended  Extremities: extremities normal, atraumatic, no cyanosis or edema Incision/Wound:well approximated no erythema or exudate   Disposition: Discharge disposition: 01-Home or Self Care       Discharge Instructions    Call MD for:  persistant nausea and vomiting   Complete by: As directed    Call MD for:  redness, tenderness, or signs of infection (pain, swelling, redness, odor or green/yellow discharge around incision site)   Complete by: As directed    Call MD for:  severe uncontrolled pain   Complete by: As directed    Call MD for:  temperature >100.4   Complete by: As directed    Diet - low sodium heart healthy   Complete by: As directed    Driving Restrictions   Complete by: As directed    Avoid driving for 1 week   Increase activity slowly   Complete by: As directed    Lifting restrictions   Complete by: As directed    Avoid lifting over 10 lbs   No wound care   Complete by: As directed    Sexual Activity  Restrictions   Complete by: As directed    Avoid sex for 6-8 weeks and until approved by Dr. Landry Mellow     Allergies as of 01/15/2020      Reactions   Morphine And Related Other (See Comments)   Increases heart rate.      Medication List    STOP taking these medications   naproxen 500 MG tablet Commonly known as: NAPROSYN     TAKE these medications   acetaminophen 500 MG tablet Commonly known as: TYLENOL Take 2 tablets (1,000 mg total) by mouth every 8 (eight) hours as needed for mild pain or moderate pain.   carbamazepine 100 MG 12 hr capsule Commonly known as: CARBATROL Take 300 mg by mouth 2 (two) times daily.   ergocalciferol 1.25 MG (50000 UT) capsule Commonly known as: VITAMIN D2 Take 50,000 Units by mouth every Monday.   FeroSul 325 (65 FE) MG tablet Generic drug: ferrous sulfate Take 325 mg by mouth daily with breakfast.   FLUoxetine 10 MG capsule Commonly known as: PROZAC Take 10 mg by mouth 2 (two) times daily.   ibuprofen 800 MG tablet Commonly known as: ADVIL Take 1 tablet (800 mg total) by mouth every 8 (eight) hours as needed.   oxyCODONE 5 MG immediate release tablet Commonly known as: Oxy IR/ROXICODONE Take 1-2 tablets (5-10 mg total) by mouth every 6 (six) hours as needed for severe pain.   Ventolin HFA 108 (90 Base) MCG/ACT inhaler Generic drug: albuterol Inhale 1 puff into the lungs every 6 (six) hours as needed for  wheezing or shortness of breath.   Vimpat 100 MG Tabs Generic drug: Lacosamide Take 100 mg by mouth 2 (two) times daily.      Follow-up Information    Christophe Louis, MD. Go in 2 week(s).   Specialty: Obstetrics and Gynecology Why: patient should already have a postoperative appointment scheduled  Contact information: [address] [place] [address] [phone]           Signed: Christophe Louis 01/15/2020, 8:19 AM

## 2020-01-15 NOTE — Discharge Instructions (Signed)
Laparoscopically Assisted Vaginal Hysterectomy, Care After This sheet gives you information about how to care for yourself after your procedure. Your health care provider may also give you more specific instructions. If you have problems or questions, contact your health care provider. What can I expect after the procedure? After the procedure, it is common to have:  Soreness and numbness in your incision areas.  Abdominal pain. You will be given pain medicine to control it.  Vaginal bleeding and discharge. You will need to use a sanitary napkin after this procedure.  Sore throat from the breathing tube that was inserted during surgery. Follow these instructions at home: Medicines  Take over-the-counter and prescription medicines only as told by your health care provider.  Do not take aspirin or ibuprofen. These medicines can cause bleeding.  Do not drive or use heavy machinery while taking prescription pain medicine.  Do not drive for 24 hours if you were given a medicine to help you relax (sedative) during the procedure. Incision care   Follow instructions from your health care provider about how to take care of your incisions. Make sure you: ? Wash your hands with soap and water before you change your bandage (dressing). If soap and water are not available, use hand sanitizer. ? Change your dressing as told by your health care provider. ? Leave stitches (sutures), skin glue, or adhesive strips in place. These skin closures may need to stay in place for 2 weeks or longer. If adhesive strip edges start to loosen and curl up, you may trim the loose edges. Do not remove adhesive strips completely unless your health care provider tells you to do that.  Check your incision area every day for signs of infection. Check for: ? Redness, swelling, or pain. ? Fluid or blood. ? Warmth. ? Pus or a bad smell. Activity  Get regular exercise as told by your health care provider. You may be  told to take short walks every day and go farther each time.  Return to your normal activities as told by your health care provider. Ask your health care provider what activities are safe for you.  Do not douche, use tampons, or have sexual intercourse for at least 6 weeks, or until your health care provider gives you permission.  Do not lift anything that is heavier than 10 lb (4.5 kg), or the limit that your health care provider tells you, until he or she says that it is safe. General instructions  Do not take baths, swim, or use a hot tub until your health care provider approves. Take showers instead of baths.  Do not drive for 24 hours if you received a sedative.  Do not drive or operate heavy machinery while taking prescription pain medicine.  To prevent or treat constipation while you are taking prescription pain medicine, your health care provider may recommend that you: ? Drink enough fluid to keep your urine clear or pale yellow. ? Take over-the-counter or prescription medicines. ? Eat foods that are high in fiber, such as fresh fruits and vegetables, whole grains, and beans. ? Limit foods that are high in fat and processed sugars, such as fried and sweet foods.  Keep all follow-up visits as told by your health care provider. This is important. Contact a health care provider if:  You have signs of infection, such as: ? Redness, swelling, or pain around your incision sites. ? Fluid or blood coming from an incision. ? An incision that feels warm to the   touch. ? Pus or a bad smell coming from an incision.  Your incision breaks open.  Your pain medicine is not helping.  You feel dizzy or light-headed.  You have pain or bleeding when you urinate.  You have persistent nausea and vomiting.  You have blood, pus, or a bad-smelling discharge from your vagina. Get help right away if:  You have a fever.  You have severe abdominal pain.  You have chest pain.  You have  shortness of breath.  You faint.  You have pain, swelling, or redness in your leg.  You have heavy bleeding from your vagina. Summary  After the procedure, it is common to have abdominal pain and vaginal bleeding.  You should not drive or lift heavy objects until your health care provider says that it is safe.  Contact your health care provider if you have any symptoms of infection, excessive vaginal bleeding, nausea, vomiting, or shortness of breath. This information is not intended to replace advice given to you by your health care provider. Make sure you discuss any questions you have with your health care provider. Document Revised: 11/02/2017 Document Reviewed: 01/16/2017 Elsevier Patient Education  2020 [employer]  

## 2020-10-26 ENCOUNTER — Ambulatory Visit
Admission: RE | Admit: 2020-10-26 | Discharge: 2020-10-26 | Disposition: A | Discharge status: Home / Self Care | Payer: Medicare HMO | Source: Ambulatory Visit | Attending: Family | Admitting: Family

## 2020-10-26 ENCOUNTER — Other Ambulatory Visit: Payer: Self-pay | Admitting: Family

## 2020-10-26 DIAGNOSIS — M79662 Pain in left lower leg: Secondary | ICD-10-CM

## 2022-09-18 ENCOUNTER — Other Ambulatory Visit: Payer: Self-pay | Admitting: Family

## 2022-09-18 DIAGNOSIS — R0989 Other specified symptoms and signs involving the circulatory and respiratory systems: Secondary | ICD-10-CM

## 2022-09-22 ENCOUNTER — Other Ambulatory Visit: Payer: Medicare HMO

## 2023-01-29 ENCOUNTER — Other Ambulatory Visit: Payer: Self-pay | Admitting: Family

## 2023-01-29 DIAGNOSIS — R220 Localized swelling, mass and lump, head: Secondary | ICD-10-CM

## 2023-02-06 ENCOUNTER — Ambulatory Visit
Admission: RE | Admit: 2023-02-06 | Discharge: 2023-02-06 | Disposition: A | Discharge status: Home / Self Care | Payer: Medicare HMO | Source: Ambulatory Visit | Attending: Family | Admitting: Family

## 2023-02-06 ENCOUNTER — Other Ambulatory Visit: Payer: Self-pay | Admitting: Family

## 2023-02-06 DIAGNOSIS — R202 Paresthesia of skin: Secondary | ICD-10-CM

## 2023-02-09 ENCOUNTER — Other Ambulatory Visit: Payer: Self-pay | Admitting: Family

## 2023-02-09 DIAGNOSIS — M542 Cervicalgia: Secondary | ICD-10-CM

## 2023-02-09 DIAGNOSIS — R202 Paresthesia of skin: Secondary | ICD-10-CM

## 2023-03-09 ENCOUNTER — Ambulatory Visit
Admission: RE | Admit: 2023-03-09 | Discharge: 2023-03-09 | Disposition: A | Discharge status: Home / Self Care | Payer: Medicare HMO | Source: Ambulatory Visit | Attending: Family | Admitting: Family

## 2023-03-09 DIAGNOSIS — R202 Paresthesia of skin: Secondary | ICD-10-CM

## 2023-03-09 DIAGNOSIS — M542 Cervicalgia: Secondary | ICD-10-CM

## 2024-05-20 ENCOUNTER — Emergency Department (HOSPITAL_COMMUNITY)

## 2024-05-20 ENCOUNTER — Other Ambulatory Visit: Payer: Self-pay

## 2024-05-20 ENCOUNTER — Encounter (HOSPITAL_COMMUNITY): Payer: Self-pay

## 2024-05-20 ENCOUNTER — Emergency Department (HOSPITAL_COMMUNITY)
Admission: EM | Admit: 2024-05-20 | Discharge: 2024-05-20 | Disposition: A | Discharge status: Home / Self Care | Attending: Emergency Medicine | Admitting: Emergency Medicine

## 2024-05-20 DIAGNOSIS — I10 Essential (primary) hypertension: Secondary | ICD-10-CM | POA: Diagnosis not present

## 2024-05-20 DIAGNOSIS — R1032 Left lower quadrant pain: Secondary | ICD-10-CM | POA: Insufficient documentation

## 2024-05-20 DIAGNOSIS — Z79899 Other long term (current) drug therapy: Secondary | ICD-10-CM | POA: Insufficient documentation

## 2024-05-20 DIAGNOSIS — Z90710 Acquired absence of both cervix and uterus: Secondary | ICD-10-CM | POA: Diagnosis not present

## 2024-05-20 DIAGNOSIS — R935 Abnormal findings on diagnostic imaging of other abdominal regions, including retroperitoneum: Secondary | ICD-10-CM | POA: Diagnosis not present

## 2024-05-20 DIAGNOSIS — R109 Unspecified abdominal pain: Secondary | ICD-10-CM

## 2024-05-20 DIAGNOSIS — R112 Nausea with vomiting, unspecified: Secondary | ICD-10-CM | POA: Insufficient documentation

## 2024-05-20 LAB — URINALYSIS, ROUTINE W REFLEX MICROSCOPIC
Bilirubin Urine: NEGATIVE
Glucose, UA: NEGATIVE mg/dL
Hgb urine dipstick: NEGATIVE
Ketones, ur: 20 mg/dL — AB
Leukocytes,Ua: NEGATIVE
Nitrite: POSITIVE — AB
Protein, ur: NEGATIVE mg/dL
Specific Gravity, Urine: 1.024 (ref 1.005–1.030)
pH: 6 (ref 5.0–8.0)

## 2024-05-20 LAB — COMPREHENSIVE METABOLIC PANEL WITH GFR
ALT: 28 U/L (ref 0–44)
AST: 31 U/L (ref 15–41)
Albumin: 4.2 g/dL (ref 3.5–5.0)
Alkaline Phosphatase: 69 U/L (ref 38–126)
Anion gap: 10 (ref 5–15)
BUN: 16 mg/dL (ref 6–20)
CO2: 24 mmol/L (ref 22–32)
Calcium: 9.9 mg/dL (ref 8.9–10.3)
Chloride: 108 mmol/L (ref 98–111)
Creatinine, Ser: 0.66 mg/dL (ref 0.44–1.00)
GFR, Estimated: >60 mL/min (ref >60)
Glucose, Bld: 134 mg/dL — ABNORMAL HIGH (ref 70–99)
Potassium: 3.7 mmol/L (ref 3.5–5.1)
Sodium: 142 mmol/L (ref 135–145)
Total Bilirubin: 0.4 mg/dL (ref 0.0–1.2)
Total Protein: 7.4 g/dL (ref 6.5–8.1)

## 2024-05-20 LAB — CBC
HCT: 38.1 % (ref 36.0–46.0)
Hemoglobin: 12.4 g/dL (ref 12.0–15.0)
MCH: 32 pg (ref 26.0–34.0)
MCHC: 32.5 g/dL (ref 30.0–36.0)
MCV: 98.2 fL (ref 80.0–100.0)
Platelets: 207 10*3/uL (ref 150–400)
RBC: 3.88 MIL/uL (ref 3.87–5.11)
RDW: 14.6 % (ref 11.5–15.5)
WBC: 7 10*3/uL (ref 4.0–10.5)
nRBC: 0 % (ref 0.0–0.2)

## 2024-05-20 LAB — HCG, SERUM, QUALITATIVE: Preg, Serum: NEGATIVE

## 2024-05-20 LAB — LIPASE, BLOOD: Lipase: 24 U/L (ref 11–51)

## 2024-05-20 MED ORDER — IOHEXOL 350 MG/ML SOLN
75.0000 mL | Freq: Once | INTRAVENOUS | Status: AC | PRN
  Administered 2024-05-20: 75 mL via INTRAVENOUS

## 2024-05-20 MED ORDER — ONDANSETRON 4 MG PO TBDP
4.0000 mg | ORAL_TABLET | Freq: Once | ORAL | Status: AC
  Administered 2024-05-20: 4 mg via ORAL
  Filled 2024-05-20: qty 1

## 2024-05-20 MED ORDER — HYDROCODONE-ACETAMINOPHEN 5-325 MG PO TABS: 1.0000 | ORAL_TABLET | Freq: Four times a day (QID) | ORAL | 0 refills | Status: DC | PRN

## 2024-05-20 MED ORDER — HYDROMORPHONE HCL 1 MG/ML IJ SOLN
1.0000 mg | Freq: Once | INTRAMUSCULAR | Status: AC
  Administered 2024-05-20: 1 mg via INTRAVENOUS
  Filled 2024-05-20: qty 1

## 2024-05-20 MED ORDER — MORPHINE SULFATE (PF) 4 MG/ML IV SOLN: 4.0000 mg | Freq: Once | INTRAVENOUS | Status: DC

## 2024-05-20 MED ORDER — ONDANSETRON HCL 4 MG/2ML IJ SOLN
4.0000 mg | Freq: Once | INTRAMUSCULAR | Status: AC
  Administered 2024-05-20: 4 mg via INTRAVENOUS
  Filled 2024-05-20: qty 2

## 2024-05-20 MED ORDER — SODIUM CHLORIDE 0.9 % IV BOLUS
1000.0000 mL | Freq: Once | INTRAVENOUS | Status: AC
  Administered 2024-05-20: 1000 mL via INTRAVENOUS

## 2024-05-20 NOTE — ED Notes (Signed)
Provider at bedside for IV access.

## 2024-05-20 NOTE — Discharge Instructions (Addendum)
 Evaluation today revealed concern for possible ovarian cancer.  Please go to your scheduled appointment with OB/GYN.  If you develop worsening abdominal pain, abnormal vaginal bleeding or discharge, fever or any other concerning symptom please return to the ED for further evaluation.

## 2024-05-20 NOTE — ED Provider Triage Note (Signed)
 Emergency Medicine Provider Triage Evaluation Note  NAKEISHA GREENHOUSE , a 48 y.o. female  was evaluated in triage.  Pt complains of LLQ abdominal pain which woke her from sleep 2 hours ago.  some vomiting.  No diarrhea.  No fever/chills.  Prior hysterectomy and c-sections.  Review of Systems  Positive: Abdominal pain Negative: fever  Physical Exam  BP (!) 153/92 (BP Location: Left Arm)   Pulse 63   Temp 97.6 F (36.4 C)   Resp 18   Wt 85 kg   SpO2 100%   BMI 34.27 kg/m  Gen:   Awake, no distress   Resp:  Normal effort  MSK:   Moves extremities without difficulty  Other:  Tender LLQ   Medical Decision Making  Medically screening exam initiated at 1:34 AM.  Appropriate orders placed.  Shyniece N Moore-Adams was informed that the remainder of the evaluation will be completed by another provider, this initial triage assessment does not replace that evaluation, and the importance of remaining in the ED until their evaluation is complete.  LLQ pain.  Labs, CT ordered.   Coretha Dew, PA-C 05/20/24 [phone]

## 2024-05-20 NOTE — ED Notes (Signed)
IV attempted x2 without success.

## 2024-05-20 NOTE — ED Notes (Signed)
 IV team at bedside

## 2024-05-20 NOTE — ED Triage Notes (Signed)
 Pt to ED by [facility] from home with c/o LLQ abdominal pain. Pt also reports emesis, no diarrhea. VSS, NADN.

## 2024-05-20 NOTE — ED Provider Notes (Signed)
 [place] [facility] Provider Note   CSN: [id number] Arrival date & time: 05/20/24  0115     Patient presents with: Abdominal Pain  HPI Veronica Francis is a 48 y.o. female with hypertension, seizures, anemia presenting for abdominal pain.  Started yesterday.  Located in the left lower quadrant.  Endorsing emesis and nausea but no diarrhea.  Denies urinary symptoms.  Denies abnormal vaginal discharge or bleeding.  Has history of prior hysterectomy and multiple C-sections.    Abdominal Pain      Prior to Admission medications   Medication Sig Start Date End Date Taking? Authorizing Provider  HYDROcodone -acetaminophen  (NORCO/VICODIN) 5-325 MG tablet Take 1 tablet by mouth every 6 (six) hours as needed. 05/20/24  Yes Reola Buckles K, PA-C  acetaminophen  (TYLENOL ) 500 MG tablet Take 2 tablets (1,000 mg total) by mouth every 8 (eight) hours as needed for mild pain or moderate pain. 01/15/20   Rosalva Sawyer, MD  carbamazepine  (CARBATROL ) 100 MG 12 hr capsule Take 300 mg by mouth 2 (two) times daily.  08/13/18   [provider]  ergocalciferol (VITAMIN D2) 1.25 MG (50000 UT) capsule Take 50,000 Units by mouth every Monday.    [provider]  FEROSUL 325 (65 Fe) MG tablet Take 325 mg by mouth daily with breakfast.  08/13/18   [provider]  FLUoxetine  (PROZAC ) 10 MG capsule Take 10 mg by mouth 2 (two) times daily.     [provider]  ibuprofen  (ADVIL ) 800 MG tablet Take 1 tablet (800 mg total) by mouth every 8 (eight) hours as needed. 01/15/20   Rosalva Sawyer, MD  oxyCODONE  (OXY IR/ROXICODONE ) 5 MG immediate release tablet Take 1-2 tablets (5-10 mg total) by mouth every 6 (six) hours as needed for severe pain. 01/15/20   Rosalva Sawyer, MD  VENTOLIN  HFA 108 (90 Base) MCG/ACT inhaler Inhale 1 puff into the lungs every 6 (six) hours as needed for wheezing or shortness of breath.  07/14/16   [provider]  VIMPAT  100 MG TABS  Take 100 mg by mouth 2 (two) times daily. 06/28/16   [provider]    Allergies: Morphine  and codeine    Review of Systems  Gastrointestinal:  Positive for abdominal pain.    Updated Vital Signs BP (!) 155/84   Pulse (!) 54   Temp 97.7 F (36.5 C) (Temporal)   Resp 20   Wt 85 kg   SpO2 100%   BMI 34.27 kg/m   Physical Exam Vitals and nursing note reviewed.  HENT:     Head: Normocephalic and atraumatic.     Mouth/Throat:     Mouth: Mucous membranes are moist.   Eyes:     General:        Right eye: No discharge.        Left eye: No discharge.     Conjunctiva/sclera: Conjunctivae normal.    Cardiovascular:     Rate and Rhythm: Normal rate and regular rhythm.     Pulses: Normal pulses.     Heart sounds: Normal heart sounds.  Pulmonary:     Effort: Pulmonary effort is normal.     Breath sounds: Normal breath sounds.  Abdominal:     General: Abdomen is flat. There is no distension.     Palpations: Abdomen is soft.     Tenderness: There is abdominal tenderness in the left lower quadrant.   Skin:    General: Skin is warm and dry.  Neurological:     General: No focal deficit present.   Psychiatric:        Mood and Affect: Mood normal.     (all labs ordered are listed, but only abnormal results are displayed) Labs Reviewed  COMPREHENSIVE METABOLIC PANEL WITH GFR - Abnormal; Notable for the following components:      Result Value   Glucose, Bld 134 (*)    All other components within normal limits  URINALYSIS, ROUTINE W REFLEX MICROSCOPIC - Abnormal; Notable for the following components:   APPearance HAZY (*)    Ketones, ur 20 (*)    Nitrite POSITIVE (*)    Bacteria, UA RARE (*)    All other components within normal limits  LIPASE, BLOOD  HCG, SERUM, QUALITATIVE  CBC  CA 125  CEA  CANCER ANTIGEN 19-9    EKG: None  Radiology: CT ABDOMEN PELVIS W CONTRAST Result Date: 05/20/2024 CLINICAL DATA:  Left lower quadrant abdominal pain.   Vomiting. EXAM: CT ABDOMEN AND PELVIS WITH CONTRAST TECHNIQUE: Multidetector CT imaging of the abdomen and pelvis was performed using the standard protocol following bolus administration of intravenous contrast. RADIATION DOSE REDUCTION: This exam was performed according to the departmental dose-optimization program which includes automated exposure control, adjustment of the mA and/or kV according to patient size and/or use of iterative reconstruction technique. CONTRAST:  75mL OMNIPAQUE  IOHEXOL  350 MG/ML SOLN COMPARISON:  06/26/2011 FINDINGS: Lower chest: Unremarkable. Hepatobiliary: No suspicious focal abnormality within the liver parenchyma. Gallbladder is surgically absent. No intrahepatic or extrahepatic biliary dilation. Pancreas: No focal mass lesion. No dilatation of the main duct. No intraparenchymal cyst. No peripancreatic edema. Spleen: No splenomegaly. No suspicious focal mass lesion. Adrenals/Urinary Tract: No adrenal nodule or mass. Kidneys unremarkable. No evidence for hydroureter. The urinary bladder appears normal for the degree of distention. Stomach/Bowel: Stomach is unremarkable. No gastric wall thickening. No evidence of outlet obstruction. Duodenum is normally positioned as is the ligament of Treitz. No small bowel wall thickening. No small bowel dilatation. The terminal ileum is normal. The appendix is normal. No gross colonic mass. No colonic wall thickening. Vascular/Lymphatic: No abdominal aortic aneurysm. No abdominal aortic atherosclerotic calcification. There is no gastrohepatic or hepatoduodenal ligament lymphadenopathy. No retroperitoneal or mesenteric lymphadenopathy. No pelvic sidewall lymphadenopathy. Reproductive: Hysterectomy. 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 Dominant cystic component shows layering debris (62/3). There is a small collection of macroscopic fat density along the inferior aspect of the lesion. Although difficult to determine  laterality, this likely arises from the left ovary with nonenlarged right ovary visible on axial 57/3. Other: No substantial intraperitoneal free fluid. 1.8 by 0.9 cm low-density nodule is seen in the anterior right mesentery of the pelvis on image 60/3. Musculoskeletal: No worrisome lytic or sclerotic osseous abnormality. Fixation hardware noted left proximal femur. IMPRESSION: 1. 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 Dominant cystic component shows layering debris. There is a small collection of macroscopic fat density along the inferior aspect of the lesion. Although difficult to determine laterality, this likely arises from the left ovary. The presence of macroscopic fat along the inferior aspect of the lesion raises the question of ovarian dermoid although typical ovarian dermoid is not multi cystic as seen in this setting. 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 Gyn consultation recommended. Follow-up non emergent MRI of the pelvis with and without contrast could be used to  further evaluate. 2. 1.8 x 0.9 cm low-density nodule in the anterior right mesentery of the pelvis. This is nonspecific and may be related to the above process. Attention on follow-up recommended. Electronically Signed   By: Camellia Candle M.D.   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  Risk Prescription drug management.   Initial Impression and Ddx Already 71-year-old well-appearing female presenting for abdominal pain.  Exam notable for left lower quadrant tenderness.  DDx includes diverticulitis, kidney stone, UTI, ovarian torsion, other. 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  Discussed patient with Dr. Izell of OB/GYN who advised to order CA125, CEA, and cancer antigen 19-9 and advised that if she is clinically well that it would be appropriate for her to follow-up for CT findings in the clinic.  He was also able to schedule an appointment for June 27.  We discussed return precautions .  Sent home with a few tablets of Norco.  Advised to follow-up with OB/GYN.  Fluid challenge with no issue.  Discharged good condition.  Patient management required discussion with the following services or consulting groups:  OB/GYN  Complexity of Problems Addressed Acute complicated illness or Injury  Additional Data Reviewed and Analyzed Further history obtained from: Past medical history and medications listed in the EMR and Prior ED visit notes  Patient Encounter Risk Assessment Consideration  of hospitalization      Final diagnoses:  Abdominal pain, unspecified abdominal location    ED Discharge Orders          Ordered    HYDROcodone -acetaminophen  (NORCO/VICODIN) 5-325 MG tablet  Every 6 hours PRN        05/20/24 1149               Lang Norleen POUR, PA-C 05/20/24 1149    Long, Joshua G, MD 05/30/24 [phone]

## 2024-05-20 NOTE — ED Notes (Signed)
 Assisted patient to bathroom Urine specimen provided.

## 2024-05-21 LAB — CEA: CEA: 5.8 ng/mL — ABNORMAL HIGH (ref 0.0–4.7)

## 2024-05-21 LAB — CANCER ANTIGEN 19-9: CA 19-9: 11 U/mL (ref 0–35)

## 2024-05-21 LAB — CA 125: Cancer Antigen (CA) 125: 5.6 U/mL (ref 0.0–38.1)

## 2024-05-30 ENCOUNTER — Ambulatory Visit: Admitting: Obstetrics and Gynecology

## 2024-07-08 ENCOUNTER — Telehealth: Payer: Self-pay

## 2024-07-08 NOTE — Telephone Encounter (Signed)
 Left message for patient to call back and schedule new patient appointment on 8/14 or 8/15.SABRA

## 2024-07-09 NOTE — Telephone Encounter (Signed)
 Left 2nd message for patient to call back and have her new patient appointment scheduled.

## 2024-07-10 ENCOUNTER — Telehealth: Payer: Self-pay

## 2024-07-10 ENCOUNTER — Telehealth: Payer: Self-pay | Admitting: Family

## 2024-07-10 NOTE — Telephone Encounter (Signed)
 I transferred Natahsa's call to April as she was calling in to make a [facility] appointment.

## 2024-07-10 NOTE — Telephone Encounter (Signed)
 Spoke with the patient regarding the referral to GYN oncology. Patient scheduled as new patient with Dr Viktoria on 07/18/2024. Patient given an arrival time of 9:15am.  Explained to the patient the the doctor will perform a pelvic exam at this visit. Patient given the policy that only one visitor allowed and that visitor must be over 16 yrs are allowed in the Cancer Center. Patient given the address/phone number for the clinic and that the center offers free valet service. Patient aware that masks required.

## 2024-07-15 ENCOUNTER — Encounter: Payer: Self-pay | Admitting: Gynecologic Oncology

## 2024-07-15 DIAGNOSIS — Z9682 Presence of neurostimulator: Secondary | ICD-10-CM | POA: Insufficient documentation

## 2024-07-17 ENCOUNTER — Encounter: Payer: Self-pay | Admitting: Gynecologic Oncology
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
## 2024-07-18 ENCOUNTER — Inpatient Hospital Stay: Admitting: Gynecologic Oncology

## 2024-07-18 ENCOUNTER — Inpatient Hospital Stay

## 2024-07-18 ENCOUNTER — Inpatient Hospital Stay: Attending: Gynecologic Oncology | Admitting: Gynecologic Oncology

## 2024-07-18 ENCOUNTER — Telehealth: Payer: Self-pay

## 2024-07-18 ENCOUNTER — Encounter: Payer: Self-pay | Admitting: Gynecologic Oncology

## 2024-07-18 VITALS — BP 108/60 | HR 60 | Temp 98.1°F | Resp 19 | Ht 62.0 in | Wt 160.6 lb

## 2024-07-18 DIAGNOSIS — R634 Abnormal weight loss: Secondary | ICD-10-CM | POA: Insufficient documentation

## 2024-07-18 DIAGNOSIS — R232 Flushing: Secondary | ICD-10-CM

## 2024-07-18 DIAGNOSIS — R970 Elevated carcinoembryonic antigen [CEA]: Secondary | ICD-10-CM | POA: Insufficient documentation

## 2024-07-18 DIAGNOSIS — R1900 Intra-abdominal and pelvic swelling, mass and lump, unspecified site: Secondary | ICD-10-CM | POA: Diagnosis not present

## 2024-07-18 DIAGNOSIS — Z90710 Acquired absence of both cervix and uterus: Secondary | ICD-10-CM | POA: Diagnosis not present

## 2024-07-18 DIAGNOSIS — Z8041 Family history of malignant neoplasm of ovary: Secondary | ICD-10-CM | POA: Diagnosis not present

## 2024-07-18 DIAGNOSIS — I10 Essential (primary) hypertension: Secondary | ICD-10-CM | POA: Insufficient documentation

## 2024-07-18 DIAGNOSIS — G4733 Obstructive sleep apnea (adult) (pediatric): Secondary | ICD-10-CM | POA: Insufficient documentation

## 2024-07-18 DIAGNOSIS — N9489 Other specified conditions associated with female genital organs and menstrual cycle: Secondary | ICD-10-CM

## 2024-07-18 DIAGNOSIS — G40909 Epilepsy, unspecified, not intractable, without status epilepticus: Secondary | ICD-10-CM | POA: Diagnosis not present

## 2024-07-18 DIAGNOSIS — N951 Menopausal and female climacteric states: Secondary | ICD-10-CM | POA: Insufficient documentation

## 2024-07-18 DIAGNOSIS — R569 Unspecified convulsions: Secondary | ICD-10-CM

## 2024-07-18 DIAGNOSIS — F32A Depression, unspecified: Secondary | ICD-10-CM | POA: Diagnosis not present

## 2024-07-18 DIAGNOSIS — N941 Unspecified dyspareunia: Secondary | ICD-10-CM | POA: Insufficient documentation

## 2024-07-18 DIAGNOSIS — Z9079 Acquired absence of other genital organ(s): Secondary | ICD-10-CM | POA: Diagnosis not present

## 2024-07-18 DIAGNOSIS — R6881 Early satiety: Secondary | ICD-10-CM | POA: Insufficient documentation

## 2024-07-18 DIAGNOSIS — Z9682 Presence of neurostimulator: Secondary | ICD-10-CM | POA: Insufficient documentation

## 2024-07-18 DIAGNOSIS — F419 Anxiety disorder, unspecified: Secondary | ICD-10-CM | POA: Diagnosis not present

## 2024-07-18 DIAGNOSIS — K5909 Other constipation: Secondary | ICD-10-CM

## 2024-07-18 NOTE — Progress Notes (Signed)
 Patient here for new patient consultation with Dr. Viktoria and for a pre-operative appointment prior to her scheduled surgery on 08/20/24. She is scheduled for robotic assisted laparoscopic unilateral versus bilateral oophorectomy, possible staging if a precancer or cancer is seen, possible laparotomy. The surgery was discussed in detail.  See after visit summary for additional details.    Discussed post-op pain management in detail including the aspects of the enhanced recovery pathway.  Advised her that a new prescription would be sent in closer to the surgery date and it is only to be used for after her upcoming surgery.  We discussed the use of tylenol  post-op and to monitor for a maximum of 4,000 mg in a 24 hour period.  Also prescribe sennakot to be used after surgery and to hold if having loose stools.  Discussed bowel regimen in detail.     Discussed measures to take at home to prevent DVT including frequent mobility.  Reportable signs and symptoms of DVT discussed. Post-operative instructions discussed and expectations for after surgery. Incisional care discussed as well including reportable signs and symptoms including erythema, drainage, wound separation.     10 minutes spent preparing information and with the patient.  Verbalizing understanding of material discussed. No needs or concerns voiced at the end of the visit.   Advised patient to call for any needs.     This appointment is included in the global surgical bundle as pre-operative teaching and has no charge.

## 2024-07-18 NOTE — Telephone Encounter (Signed)
 Faxed over surgical clearance to PCP

## 2024-07-18 NOTE — Patient Instructions (Addendum)
 We will obtain lab work today and notify you of the results.   Preparing for your Surgery   Plan for surgery on 08/20/2024 with Dr. Comer Dollar at [facility]. You will be scheduled for robotic assisted laparoscopic unilateral versus bilateral oophorectomy (removal of one or both ovaries), possible staging if a precancer or cancer is seen, possible laparotomy (larger incision on your abdomen if needed).    Pre-operative Testing -You will receive a phone call from presurgical testing at [facility] to arrange for a pre-operative appointment and lab work.   -Bring your insurance card, copy of an advanced directive if applicable, medication list   -At that visit, you will be asked to sign a consent for a possible blood transfusion in case a transfusion becomes necessary during surgery.  The need for a blood transfusion is rare but having consent is a necessary part of your care.      -You should not be taking blood thinners or aspirin at least ten days prior to surgery unless instructed by your surgeon.   -Do not take supplements such as fish oil (omega 3), red yeast rice, turmeric before your surgery. STOP TAKING AT LEAST 10 DAYS BEFORE SURGERY. You want to avoid medications with aspirin in them including headache powders such as BC or Goody's), Excedrin migraine.   -If you are taking a GLP-1 medication/injection such as Ozempic, Mounjaro, [id number], this needs to be held before surgery for at least 7 days before.   Day Before Surgery at Home -You will be asked to take in a light diet the day before surgery. You will be advised you can have clear liquids up until 3 hours before your surgery.     Eat a light diet the day before surgery.  Examples including soups, broths, toast, yogurt, mashed potatoes.  AVOID GAS PRODUCING FOODS AND BEVERAGES. Things to avoid include carbonated beverages (fizzy beverages, sodas), raw fruits and raw vegetables (uncooked), or beans.    If your  bowels are filled with gas, your surgeon will have difficulty visualizing your pelvic organs which increases your surgical risks.   Your role in recovery Your role is to become active as soon as directed by your doctor, while still giving yourself time to heal.  Rest when you feel tired. You will be asked to do the following in order to speed your recovery:   - Cough and breathe deeply. This helps to clear and expand your lungs and can prevent pneumonia after surgery.  - STAY ACTIVE WHEN YOU GET HOME. Do mild physical activity. Walking or moving your legs help your circulation and body functions return to normal. Do not try to get up or walk alone the first time after surgery.   -If you develop swelling on one leg or the other, pain in the back of your leg, redness/warmth in one of your legs, please call the office or go to the Emergency Room to have a doppler to rule out a blood clot. For shortness of breath, chest pain-seek care in the Emergency Room as soon as possible. - Actively manage your pain. Managing your pain lets you move in comfort. We will ask you to rate your pain on a scale of zero to 10. It is your responsibility to tell your doctor or nurse where and how much you hurt so your pain can be treated.   Special Considerations -If you are diabetic, you may be placed on insulin after surgery to have closer control over  you will be discharged on insulin.  If applicable, your oral antidiabetics will be resumed when you are tolerating a solid diet.  -Your final pathology results from surgery should be available around one week after surgery and the results will be relayed to you when available.  -FMLA forms can be faxed to [phone] and please allow 5-7 business days for completion.  Pain Management After Surgery -You will be prescribed your pain medication and bowel regimen medications before surgery closer to the date so  that you can have these available when you are discharged from the hospital. The pain medication is for use ONLY AFTER surgery and a new prescription will not be given.   -Make sure that you have Tylenol  and Ibuprofen  IF YOU ARE ABLE TO TAKE THESE MEDICATIONS at home to use on a regular basis after surgery for pain control. We recommend alternating the medications every hour to six hours since they work differently and are processed in the body differently for pain relief.  -Review the attached handout on narcotic use and their risks and side effects.   Bowel Regimen -You will be prescribed Sennakot-S to take nightly to prevent constipation especially if you are taking the narcotic pain medication intermittently.  It is important to prevent constipation and drink adequate amounts of liquids. You can stop taking this medication when you are not taking pain medication and you are back on your normal bowel routine.  Risks of Surgery Risks of surgery are low but include bleeding, infection, damage to surrounding structures, re-operation, blood clots, and very rarely death.   Blood Transfusion Information (For the consent to be signed before surgery)  We will be checking your blood type before surgery so in case of emergencies, we will know what type of blood you would need.                                            WHAT IS A BLOOD TRANSFUSION?  A transfusion is the replacement of blood or some of its parts. Blood is made up of multiple cells which provide different functions. Red blood cells carry oxygen  and are used for blood loss replacement. White blood cells fight against infection. Platelets control bleeding. Plasma helps clot blood. Other blood products are available for specialized needs, such as hemophilia or other clotting disorders. BEFORE THE TRANSFUSION  Who gives blood for transfusions?  You may be able to donate blood to be used at a later date on yourself (autologous  donation). Relatives can be asked to donate blood. This is generally not any safer than if you have received blood from a stranger. The same precautions are taken to ensure safety when a relative's blood is donated. Healthy volunteers who are fully evaluated to make sure their blood is safe. This is blood bank blood. Transfusion therapy is the safest it has ever been in the practice of medicine. Before blood is taken from a donor, a complete history is taken to make sure that person has no history of diseases nor engages in risky social behavior (examples are intravenous drug use or sexual activity with multiple partners). The donor's travel history is screened to minimize risk of transmitting infections, such as malaria. The donated blood is tested for signs of infectious diseases, such as HIV and hepatitis. The blood is then tested to be sure it is compatible with you in  is tested for signs of infectious diseases, such as HIV and hepatitis. The blood is then tested to be sure it is compatible with you in order to minimize the chance of a transfusion reaction. If you or a relative donates blood, this is often done in anticipation of surgery and is not appropriate for emergency situations. It takes many days to process the donated blood. RISKS AND COMPLICATIONS Although transfusion therapy is very safe and saves many lives, the main dangers of transfusion include:  Getting an infectious disease. Developing a transfusion reaction. This is an allergic reaction to something in the blood you were given. Every precaution is taken to prevent this. The decision to have a blood transfusion has been considered carefully by your caregiver before blood is given. Blood is not given unless the benefits outweigh the risks.   AFTER SURGERY INSTRUCTIONS   Return to work: 4-6 weeks if applicable   Activity: 1. Be up and out of the bed during the day.  Take a nap if needed.  You may walk up steps but be careful and use the hand rail.  Stair climbing will tire you more than you think, you may need to stop part way and rest.    2.  No lifting or straining for 6 weeks over 10 pounds. No pushing, pulling, straining for 6 weeks.   3. No driving for 4-89 days when the following criteria have been met: Do not drive if you are taking narcotic pain medicine and make sure that your reaction time has returned.    4. You can shower as soon as the next day after surgery. Shower daily.  Use your regular soap and water (not directly on the incision) and pat your incision(s) dry afterwards; don't rub.  No tub baths or submerging your body in water until cleared by your surgeon. If you have the soap that was given to you by pre-surgical testing that was used before surgery, you do not need to use it afterwards because this can irritate your incisions.    5. No sexual activity and nothing in the vagina for 6 weeks.   6. You may experience a small amount of clear drainage from your incisions, which is normal.  If the drainage persists, increases, or changes color please call the office.   7. Do not use creams, lotions, or ointments such as neosporin on your incisions after surgery until advised by your surgeon because they can cause removal of the dermabond glue on your incisions.     8. You may experience vaginal spotting after surgery or when the stitches at the top of the vagina begin to dissolve.  The spotting is normal but if you experience heavy bleeding, call our office.   9. Take Tylenol  or ibuprofen  first for pain if you are able to take these medications and only use narcotic pain medication for severe pain not relieved by the Tylenol  or Ibuprofen .  Monitor your Tylenol  intake to a max of 4,000 mg in a 24 hour period. You can alternate these medications after surgery.   Diet: 1. Low sodium Heart Healthy Diet is recommended but you are cleared to resume your normal (before surgery) diet after your procedure.   2. It is safe to use a laxative, such as Miralax  or Colace, if you have difficulty moving your bowels before surgery. You  have been prescribed Sennakot-S to take at bedtime every evening after surgery to keep bowel movements regular and to prevent constipation.  Wound Care: 1. Keep clean and dry.  Shower daily.   Reasons to call the Doctor: Fever - Oral temperature greater than 100.4 degrees Fahrenheit Foul-smelling vaginal discharge Difficulty urinating Nausea and vomiting Increased pain at the site of the incision that is unrelieved with pain medicine. Difficulty breathing with or without chest pain New calf pain especially if only on one side Sudden, continuing increased vaginal bleeding with or without clots.   Contacts: For questions or concerns you should contact:   Dr. Comer Dollar at [phone]   Eleanor Epps, NP at [phone]   After Hours: call [phone] and have the GYN Oncologist paged/contacted (after 5 pm or on the weekends). You will speak with an after hours RN and let he or she know you have had surgery.   Messages sent via mychart are for non-urgent matters and are not responded to after hours so for urgent needs, please call the after hours number.

## 2024-07-18 NOTE — Patient Instructions (Signed)
 We will obtain lab work today and notify you of the results.   Preparing for your Surgery   Plan for surgery on 08/20/2024 with Dr. Comer Dollar at [facility]. You will be scheduled for robotic assisted laparoscopic unilateral versus bilateral oophorectomy (removal of one or both ovaries), possible staging if a precancer or cancer is seen, possible laparotomy (larger incision on your abdomen if needed).    Pre-operative Testing -You will receive a phone call from presurgical testing at [facility] to arrange for a pre-operative appointment and lab work.   -Bring your insurance card, copy of an advanced directive if applicable, medication list   -At that visit, you will be asked to sign a consent for a possible blood transfusion in case a transfusion becomes necessary during surgery.  The need for a blood transfusion is rare but having consent is a necessary part of your care.      -You should not be taking blood thinners or aspirin at least ten days prior to surgery unless instructed by your surgeon.   -Do not take supplements such as fish oil (omega 3), red yeast rice, turmeric before your surgery. STOP TAKING AT LEAST 10 DAYS BEFORE SURGERY. You want to avoid medications with aspirin in them including headache powders such as BC or Goody's), Excedrin migraine.   -If you are taking a GLP-1 medication/injection such as Ozempic, Mounjaro, [id number], this needs to be held before surgery for at least 7 days before.   Day Before Surgery at Home -You will be asked to take in a light diet the day before surgery. You will be advised you can have clear liquids up until 3 hours before your surgery.     Eat a light diet the day before surgery.  Examples including soups, broths, toast, yogurt, mashed potatoes.  AVOID GAS PRODUCING FOODS AND BEVERAGES. Things to avoid include carbonated beverages (fizzy beverages, sodas), raw fruits and raw vegetables (uncooked), or beans.    If your  bowels are filled with gas, your surgeon will have difficulty visualizing your pelvic organs which increases your surgical risks.   Your role in recovery Your role is to become active as soon as directed by your doctor, while still giving yourself time to heal.  Rest when you feel tired. You will be asked to do the following in order to speed your recovery:   - Cough and breathe deeply. This helps to clear and expand your lungs and can prevent pneumonia after surgery.  - STAY ACTIVE WHEN YOU GET HOME. Do mild physical activity. Walking or moving your legs help your circulation and body functions return to normal. Do not try to get up or walk alone the first time after surgery.   -If you develop swelling on one leg or the other, pain in the back of your leg, redness/warmth in one of your legs, please call the office or go to the Emergency Room to have a doppler to rule out a blood clot. For shortness of breath, chest pain-seek care in the Emergency Room as soon as possible. - Actively manage your pain. Managing your pain lets you move in comfort. We will ask you to rate your pain on a scale of zero to 10. It is your responsibility to tell your doctor or nurse where and how much you hurt so your pain can be treated.   Special Considerations -If you are diabetic, you may be placed on insulin after surgery to have closer control over  your blood sugars to promote healing and recovery.  This does not mean that you will be discharged on insulin.  If applicable, your oral antidiabetics will be resumed when you are tolerating a solid diet.   -Your final pathology results from surgery should be available around one week after surgery and the results will be relayed to you when available.   -FMLA forms can be faxed to [phone] and please allow 5-7 business days for completion.   Pain Management After Surgery -You will be prescribed your pain medication and bowel regimen medications before surgery closer  to the date so that you can have these available when you are discharged from the hospital. The pain medication is for use ONLY AFTER surgery and a new prescription will not be given.    -Make sure that you have Tylenol  and Ibuprofen  IF YOU ARE ABLE TO TAKE THESE MEDICATIONS at home to use on a regular basis after surgery for pain control. We recommend alternating the medications every hour to six hours since they work differently and are processed in the body differently for pain relief.   -Review the attached handout on narcotic use and their risks and side effects.    Bowel Regimen -You will be prescribed Sennakot-S to take nightly to prevent constipation especially if you are taking the narcotic pain medication intermittently.  It is important to prevent constipation and drink adequate amounts of liquids. You can stop taking this medication when you are not taking pain medication and you are back on your normal bowel routine.   Risks of Surgery Risks of surgery are low but include bleeding, infection, damage to surrounding structures, re-operation, blood clots, and very rarely death.     Blood Transfusion Information (For the consent to be signed before surgery)   We will be checking your blood type before surgery so in case of emergencies, we will know what type of blood you would need.                                             WHAT IS A BLOOD TRANSFUSION?   A transfusion is the replacement of blood or some of its parts. Blood is made up of multiple cells which provide different functions. Red blood cells carry oxygen  and are used for blood loss replacement. White blood cells fight against infection. Platelets control bleeding. Plasma helps clot blood. Other blood products are available for specialized needs, such as hemophilia or other clotting disorders. BEFORE THE TRANSFUSION  Who gives blood for transfusions?  You may be able to donate blood to be used at a later date on  yourself (autologous donation). Relatives can be asked to donate blood. This is generally not any safer than if you have received blood from a stranger. The same precautions are taken to ensure safety when a relative's blood is donated. Healthy volunteers who are fully evaluated to make sure their blood is safe. This is blood bank blood. Transfusion therapy is the safest it has ever been in the practice of medicine. Before blood is taken from a donor, a complete history is taken to make sure that person has no history of diseases nor engages in risky social behavior (examples are intravenous drug use or sexual activity with multiple partners). The donor's travel history is screened to minimize risk of transmitting infections, such as malaria. The donated blood  is tested for signs of infectious diseases, such as HIV and hepatitis. The blood is then tested to be sure it is compatible with you in order to minimize the chance of a transfusion reaction. If you or a relative donates blood, this is often done in anticipation of surgery and is not appropriate for emergency situations. It takes many days to process the donated blood. RISKS AND COMPLICATIONS Although transfusion therapy is very safe and saves many lives, the main dangers of transfusion include:  Getting an infectious disease. Developing a transfusion reaction. This is an allergic reaction to something in the blood you were given. Every precaution is taken to prevent this. The decision to have a blood transfusion has been considered carefully by your caregiver before blood is given. Blood is not given unless the benefits outweigh the risks.   AFTER SURGERY INSTRUCTIONS   Return to work: 4-6 weeks if applicable   Activity: 1. Be up and out of the bed during the day.  Take a nap if needed.  You may walk up steps but be careful and use the hand rail.  Stair climbing will tire you more than you think, you may need to stop part way and rest.    2.  No lifting or straining for 6 weeks over 10 pounds. No pushing, pulling, straining for 6 weeks.   3. No driving for 4-89 days when the following criteria have been met: Do not drive if you are taking narcotic pain medicine and make sure that your reaction time has returned.    4. You can shower as soon as the next day after surgery. Shower daily.  Use your regular soap and water (not directly on the incision) and pat your incision(s) dry afterwards; don't rub.  No tub baths or submerging your body in water until cleared by your surgeon. If you have the soap that was given to you by pre-surgical testing that was used before surgery, you do not need to use it afterwards because this can irritate your incisions.    5. No sexual activity and nothing in the vagina for 6 weeks.   6. You may experience a small amount of clear drainage from your incisions, which is normal.  If the drainage persists, increases, or changes color please call the office.   7. Do not use creams, lotions, or ointments such as neosporin on your incisions after surgery until advised by your surgeon because they can cause removal of the dermabond glue on your incisions.     8. You may experience vaginal spotting after surgery or when the stitches at the top of the vagina begin to dissolve.  The spotting is normal but if you experience heavy bleeding, call our office.   9. Take Tylenol  or ibuprofen  first for pain if you are able to take these medications and only use narcotic pain medication for severe pain not relieved by the Tylenol  or Ibuprofen .  Monitor your Tylenol  intake to a max of 4,000 mg in a 24 hour period. You can alternate these medications after surgery.   Diet: 1. Low sodium Heart Healthy Diet is recommended but you are cleared to resume your normal (before surgery) diet after your procedure.   2. It is safe to use a laxative, such as Miralax  or Colace, if you have difficulty moving your bowels before surgery. You  have been prescribed Sennakot-S to take at bedtime every evening after surgery to keep bowel movements regular and to prevent constipation.  Wound Care: 1. Keep clean and dry.  Shower daily.   Reasons to call the Doctor: Fever - Oral temperature greater than 100.4 degrees Fahrenheit Foul-smelling vaginal discharge Difficulty urinating Nausea and vomiting Increased pain at the site of the incision that is unrelieved with pain medicine. Difficulty breathing with or without chest pain New calf pain especially if only on one side Sudden, continuing increased vaginal bleeding with or without clots.   Contacts: For questions or concerns you should contact:   Dr. Comer Dollar at [phone]   Eleanor Epps, NP at [phone]   After Hours: call [phone] and have the GYN Oncologist paged/contacted (after 5 pm or on the weekends). You will speak with an after hours RN and let he or she know you have had surgery.   Messages sent via mychart are for non-urgent matters and are not responded to after hours so for urgent needs, please call the after hours number.

## 2024-07-19 LAB — LUTEINIZING HORMONE: LH: 52.6 m[IU]/mL

## 2024-07-19 LAB — FOLLICLE STIMULATING HORMONE: FSH: 103 m[IU]/mL

## 2024-07-19 LAB — ESTRADIOL: Estradiol: <5 pg/mL

## 2024-07-21 ENCOUNTER — Telehealth: Payer: Self-pay | Admitting: *Deleted

## 2024-07-21 ENCOUNTER — Ambulatory Visit: Payer: Self-pay | Admitting: Gynecologic Oncology

## 2024-07-21 NOTE — Telephone Encounter (Signed)
 Received a fax the stated Dr Prentice Sharps is no longer her PCP. Called and [facility] for the patient to call the office back. Need name of new PCP

## 2024-07-22 NOTE — Telephone Encounter (Signed)
 Ms.Moore-Adams returned a call from [place]. She states she goes to [facility] for her primary care, no specific provider name.   PCP updated in pt's chart

## 2024-07-23 NOTE — Telephone Encounter (Signed)
-----   Message from [name] sent at 07/23/2024  8:45 AM EDT ----- Please let patient know lab tests confirm menopausal status.  Thank you! ----- Message ----- From: Interface, Lab In [place] Sent: 07/19/2024   7:37 AM EDT To: [name], MD

## 2024-07-23 NOTE — Telephone Encounter (Signed)
 2nd attempt to reach patient. Unable to leave a message due to patient's mailbox being full.

## 2024-07-23 NOTE — Telephone Encounter (Signed)
 3rd attempt to reach patient to relay message from provider. Unable to leave a message due to full mailbox.

## 2024-07-23 NOTE — Telephone Encounter (Signed)
 Attempted to reach patient to relay message from provider. Unable to leave a message at this time due to patient's mailbox being full.

## 2024-07-24 ENCOUNTER — Telehealth: Payer: Self-pay | Admitting: *Deleted

## 2024-07-24 NOTE — Telephone Encounter (Signed)
 [name] returned call from [facility],   She is aware of recent lab results as reported by Dr.Tucker. She was thankful for the call.

## 2024-07-24 NOTE — Telephone Encounter (Signed)
 Per Dr Viktoria fax records and surgical optimization form to [facility] on [address]

## 2024-07-24 NOTE — Telephone Encounter (Signed)
 4 th attempt to reach patient. Left voicemail requesting call back to [phone].

## 2024-07-24 NOTE — Telephone Encounter (Signed)
-----   Message from [name] sent at 07/23/2024  8:45 AM EDT ----- Please let patient know lab tests confirm menopausal status.  Thank you! ----- Message ----- From: Interface, Lab In [place] Sent: 07/19/2024   7:37 AM EDT To: [name], MD

## 2024-07-25 ENCOUNTER — Encounter (HOSPITAL_COMMUNITY): Payer: Self-pay

## 2024-08-06 ENCOUNTER — Telehealth: Payer: Self-pay

## 2024-08-06 NOTE — Telephone Encounter (Signed)
 Our office received a call from [place] at [facility] regarding a surgical optimization form their office received from our office. Stacia reports patient has not been seen since May and would need a follow up visit. Their office has been unable to reach pt, so therefore the PCP can not sign the clearance form.  I was also unable to reach Ms.Moore-Adams d/t her voicemail is full. I reached out to her emergency contact, Gilmore, and left a voicemail for him to pass along the message to his wife to give our office a call and to also call her PCP.

## 2024-08-07 ENCOUNTER — Telehealth: Payer: Self-pay

## 2024-08-07 NOTE — Telephone Encounter (Signed)
 Veronica Francis called office reporting she is scheduled for surgery with Dr.Tucker on 9/17.  She would like to move it out about 2 months. Stating she is the main bread winner in her family and it just isn't feasibly possible for her to have the surgery right now.   Message sent to Dr.Tucker.

## 2024-08-11 ENCOUNTER — Encounter: Payer: Self-pay | Admitting: Oncology

## 2024-08-11 NOTE — Telephone Encounter (Signed)
 Left 2 voice mail messages on Veronica Francis's number and one on her husband's regarding surgery.  Will continue to try to reach her.

## 2024-08-12 NOTE — Telephone Encounter (Signed)
Left another message requested a return call.
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
## 2024-08-15 ENCOUNTER — Encounter (HOSPITAL_COMMUNITY): Admission: RE | Admit: 2024-08-15 | Source: Ambulatory Visit

## 2024-08-20 ENCOUNTER — Telehealth: Payer: Self-pay | Admitting: *Deleted

## 2024-08-20 DIAGNOSIS — N9489 Other specified conditions associated with female genital organs and menstrual cycle: Secondary | ICD-10-CM

## 2024-08-20 DIAGNOSIS — R634 Abnormal weight loss: Secondary | ICD-10-CM

## 2024-08-20 NOTE — Telephone Encounter (Signed)
 Cancelled phone visit and post op appt due to  surgery being moved. Attempted to reach the patient but no answer and voicemail full

## 2024-08-22 NOTE — Telephone Encounter (Signed)
 Patient's surgery canceled and rescheduled

## 2024-08-27 ENCOUNTER — Telehealth: Admitting: Gynecologic Oncology

## 2024-09-18 ENCOUNTER — Encounter: Admitting: Gynecologic Oncology

## 2024-10-01 ENCOUNTER — Encounter (HOSPITAL_COMMUNITY): Payer: Self-pay

## 2024-10-07 ENCOUNTER — Telehealth: Payer: Self-pay | Admitting: *Deleted

## 2024-10-07 NOTE — Telephone Encounter (Signed)
 Spoke with Veronica Francis who called the office wanting to reschedule her procedure with Dr. Viktoria on 10/23/24. Patient is requesting a date in January. Pt states the reason is for financial concern?  Advised patient that her message will be relayed to Dr. Viktoria and a phone visit with provider is recommended before rescheduling her procedure. Pt verbalized understanding.

## 2024-10-08 ENCOUNTER — Telehealth: Payer: Self-pay | Admitting: Gynecologic Oncology

## 2024-10-08 NOTE — Telephone Encounter (Signed)
 Attempted to reach out to patient about call yesterday requesting to reschedule her surgery to January due to financial reasons.  Message left.  Per Dr. Viktoria if patient is going to delay surgery further she would recommend a pelvic MRI now.  This will be relayed to the patient when she returns this call.

## 2024-10-08 NOTE — Telephone Encounter (Signed)
 Melissa - Could you please call and chat with her? If she is going to delay further, I would recommend that we get a pelvic MRI now.

## 2024-10-10 ENCOUNTER — Telehealth: Payer: Self-pay

## 2024-10-10 NOTE — Telephone Encounter (Signed)
 Per Eleanor Epps NP, I attempted to call [name] regarding her request, to pre-admit testing, reschedule her upcoming surgery with Dr.Tucker on 11/20. LVM for her to call our office today.

## 2024-10-10 NOTE — Telephone Encounter (Signed)
-----   Message from Eleanor JONETTA Epps sent at 10/10/2024  9:43 AM EST ----- Regarding: FW: Reschedule surgery Please call her this am and see if she is still wanting to cancel so I can use her spot today. Also Dr. Viktoria is recommending an MRI if she wants to further delay. She ok with doing this? Thank u ----- Message ----- From: Cristopher Pamila ORN, RN Sent: 10/10/2024   9:24 AM EST To: Eleanor JONETTA Epps, NP Subject: Reschedule surgery                             Good Morning, Who is your surgery scheduler? This patient told Shameeka our appointment scheduler that she needed to reschedule her 11/20 surgery. I wanted to see if she has made your office aware of needing to reschedule.  Thank you Pamila Cristopher [facility] Presurgical testing charge nurse

## 2024-10-13 ENCOUNTER — Telehealth: Payer: Self-pay | Admitting: *Deleted

## 2024-10-13 NOTE — Telephone Encounter (Addendum)
 Attempted to reach patient to relay we have rescheduled her 11/20 surgery as she requested and moved her surgery date to 12/10/2024. Left voicemail requesting call back to [phone].

## 2024-10-13 NOTE — Telephone Encounter (Signed)
-----   Message from Eleanor JONETTA Epps sent at 10/10/2024  9:43 AM EST ----- Regarding: FW: Reschedule surgery Please call her this am and see if she is still wanting to cancel so I can use her spot today. Also Dr. Viktoria is recommending an MRI if she wants to further delay. She ok with doing this? Thank u ----- Message ----- From: Cristopher Pamila ORN, RN Sent: 10/10/2024   9:24 AM EST To: Eleanor JONETTA Epps, NP Subject: Reschedule surgery                             Good Morning, Who is your surgery scheduler? This patient told Shameeka our appointment scheduler that she needed to reschedule her 11/20 surgery. I wanted to see if she has made your office aware of needing to reschedule.  Thank you Pamila Cristopher [facility] Presurgical testing charge nurse

## 2024-10-17 NOTE — Telephone Encounter (Signed)
 2nd attempt to reach patient to relay new surgery date and message from Dr. Viktoria. Left voicemail requesting call back to [phone].

## 2024-10-20 ENCOUNTER — Other Ambulatory Visit: Payer: Self-pay | Admitting: Gynecologic Oncology

## 2024-10-20 DIAGNOSIS — N9489 Other specified conditions associated with female genital organs and menstrual cycle: Secondary | ICD-10-CM

## 2024-10-20 DIAGNOSIS — R970 Elevated carcinoembryonic antigen [CEA]: Secondary | ICD-10-CM

## 2024-10-20 DIAGNOSIS — R634 Abnormal weight loss: Secondary | ICD-10-CM

## 2024-10-20 NOTE — Telephone Encounter (Signed)
 Spoke with Veronica Francis and she agreed to having an MRI now and she is also ok with the new surgery date of 12/10/2024. Advised patient her message will be relayed to providers and once we have an order for the MRI we will call and get that scheduled and the office will call her back.  Pt verbalized understanding and thanked the office.

## 2024-10-20 NOTE — Telephone Encounter (Signed)
-----   Message from Eleanor JONETTA Epps sent at 10/10/2024  9:43 AM EST ----- Regarding: FW: Reschedule surgery Please call her this am and see if she is still wanting to cancel so I can use her spot today. Also Dr. Viktoria is recommending an MRI if she wants to further delay. She ok with doing this? Thank u ----- Message ----- From: Cristopher Pamila ORN, RN Sent: 10/10/2024   9:24 AM EST To: Eleanor JONETTA Epps, NP Subject: Reschedule surgery                             Good Morning, Who is your surgery scheduler? This patient told Shameeka our appointment scheduler that she needed to reschedule her 11/20 surgery. I wanted to see if she has made your office aware of needing to reschedule.  Thank you Pamila Cristopher [facility] Presurgical testing charge nurse

## 2024-10-21 NOTE — Telephone Encounter (Signed)
 Spoke with MRI at [facility] and they need the name, manufacturer, model and kind of lead of patient's vagal nerve stimulator before scheduling her MRI.   Spoke with Veronica Francis who has to locate her card with that information and then she will call the office back with the information needed.

## 2024-10-22 ENCOUNTER — Telehealth: Payer: Self-pay | Admitting: *Deleted

## 2024-10-22 NOTE — Telephone Encounter (Signed)
 LMOM for the patient to call the office back and the name of her PCP   Will need to send new surgical optimization form to PCP

## 2024-10-23 ENCOUNTER — Other Ambulatory Visit: Payer: Self-pay | Admitting: Gynecologic Oncology

## 2024-10-23 ENCOUNTER — Telehealth: Payer: Self-pay | Admitting: *Deleted

## 2024-10-23 DIAGNOSIS — N9489 Other specified conditions associated with female genital organs and menstrual cycle: Secondary | ICD-10-CM

## 2024-10-23 DIAGNOSIS — R970 Elevated carcinoembryonic antigen [CEA]: Secondary | ICD-10-CM

## 2024-10-23 DIAGNOSIS — R634 Abnormal weight loss: Secondary | ICD-10-CM

## 2024-10-23 NOTE — Telephone Encounter (Signed)
 Attempted to reach patient to relay message from provider. Unable to leave a message at this time due to patient's mailbox is full.

## 2024-10-23 NOTE — Telephone Encounter (Signed)
 Spoke with MRI at [facility] and they state patient would need to have her neurosurgeon fill out a form with 7 things and then schedule an appt. The same day as the MRI to have her stimulator interrogated. Her implant from 09/14/22 is a Scientist, water quality SR 14 cc (106) lead S [id number]). Which states exp. March 21, 2024. So we are not even sure if patient had the original appt. For the interrogation according to [facility] from the [facility] at [phone].

## 2024-10-24 NOTE — Telephone Encounter (Signed)
 Spoke with Veronica Francis and relayed to patient that we are unable to schedule MRI. Advised that Dr. Viktoria would like patient to have a pelvic ultrasound. Pt agreed and this has been scheduled for Friday, 11/28 at [facility]. Pt is to arrive by 2:30 pm with a full bladder. Pt verbalized understanding and agreed to location,  date and time of appt.

## 2024-10-29 ENCOUNTER — Telehealth: Admitting: Gynecologic Oncology

## 2024-10-30 ENCOUNTER — Encounter (HOSPITAL_COMMUNITY): Payer: Self-pay

## 2024-10-31 ENCOUNTER — Ambulatory Visit (HOSPITAL_COMMUNITY)
Admission: RE | Admit: 2024-10-31 | Discharge: 2024-10-31 | Disposition: A | Discharge status: Home / Self Care | Source: Ambulatory Visit | Attending: Gynecologic Oncology | Admitting: Gynecologic Oncology

## 2024-10-31 DIAGNOSIS — N9489 Other specified conditions associated with female genital organs and menstrual cycle: Secondary | ICD-10-CM | POA: Insufficient documentation

## 2024-10-31 DIAGNOSIS — R634 Abnormal weight loss: Secondary | ICD-10-CM | POA: Diagnosis present

## 2024-10-31 DIAGNOSIS — R970 Elevated carcinoembryonic antigen [CEA]: Secondary | ICD-10-CM | POA: Insufficient documentation

## 2024-11-10 ENCOUNTER — Ambulatory Visit: Payer: Self-pay | Admitting: Gynecologic Oncology

## 2024-11-10 NOTE — Telephone Encounter (Signed)
 Spoke with patient and relayed message from Dr. Viktoria that her ultrasound results are overall very reassuring. 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 Pt verbalized understanding. Pt also reminded of her scheduled surgery on 12/10/24 and [facility]-ad[facility] should be calling her to set up a pre-admit appointment. Pt is aware of her scheduled surgery and thanked the office for calling.

## 2024-11-10 NOTE — Telephone Encounter (Signed)
 Attempted to reach Veronica Francis to relay message with results. Left voicemail requesting callback to [phone].

## 2024-11-10 NOTE — Telephone Encounter (Signed)
-----   Message from Comer Veronica Francis sent at 11/10/2024  2:15 PM EST ----- Could you please call this patient and let her know that the ultrasound is overall very reassuring? 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 Thank  you! ----- Message ----- From: Micheline Eleanor BIRCH, NP Sent: 11/07/2024   3:42 PM EST To: Comer Veronica Dollar, MD  This is the patient that continues to delay surgery. Currently scheduled for 12/10/24 ----- Message ----- From: Interface, Rad Results In Sent: 11/07/2024   3:31 PM EST To: Eleanor BIRCH Micheline, NP

## 2024-11-10 NOTE — Telephone Encounter (Signed)
-----   Message from [name] sent at 11/10/2024  2:15 PM EST ----- Could you please call this patient and let her know that the ultrasound is overall very reassuring? 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 Thank  you! ----- Message ----- From: Micheline Eleanor BIRCH, NP Sent: 11/07/2024   3:42 PM EST To: [name], MD  This is the patient that continues to delay surgery. Currently scheduled for 12/10/24 ----- Message ----- From: Interface, Rad Results In Sent: 11/07/2024   3:31 PM EST To: Eleanor BIRCH Micheline, NP

## 2024-11-12 ENCOUNTER — Telehealth: Payer: Self-pay | Admitting: *Deleted

## 2024-11-12 NOTE — Telephone Encounter (Signed)
 Pt called to back to let us  know that her primary care MD is through the [facility] medical center on [facility]

## 2024-11-12 NOTE — Telephone Encounter (Signed)
 LMOM for the patient to call the office back and the name of her PCP    Fax  surgical optimization form to PCP

## 2024-11-20 NOTE — Patient Instructions (Signed)
 Preparing for your Surgery  Plan for surgery on 12/10/2024 with Dr. Comer Dollar at [facility]. You will be scheduled for robotic assisted bilateral oophorectomy (removal of both ovaries), possible staging, possible laparotomy (larger incision if needed).  Pre-operative Testing -You will receive a phone call from presurgical testing at [facility] to arrange for a pre-operative appointment and lab work.  -Bring your insurance card, copy of an advanced directive if applicable, medication list  -At that visit, you will be asked to sign a consent for a possible blood transfusion in case a transfusion becomes necessary during surgery.  The need for a blood transfusion is rare but having consent is a necessary part of your care.     -You should not be taking blood thinners or aspirin at least ten days prior to surgery unless instructed by your surgeon.  -Do not take supplements such as fish oil (omega 3), red yeast rice, turmeric before your surgery. STOP TAKING AT LEAST 10 DAYS BEFORE SURGERY. You want to avoid medications with aspirin in them including headache powders such as BC or Goody's), Excedrin migraine.  -If you are taking a GLP-1 medication/injection such as Ozempic, Mounjaro, [id number], this needs to be held before surgery for at least 7 days before.  Day Before Surgery at Home -You will be asked to take in a light diet the day before surgery. You will be advised you can have clear liquids up until 3 hours before your surgery.    Eat a light diet the day before surgery.  Examples including soups, broths, toast, yogurt, mashed potatoes.  AVOID GAS PRODUCING FOODS AND BEVERAGES. Things to avoid include carbonated beverages (fizzy beverages, sodas), raw fruits and raw vegetables (uncooked), or beans.   If your bowels are filled with gas, your surgeon will have difficulty visualizing your pelvic organs which increases your surgical risks.  Your role in recovery Your role  is to become active as soon as directed by your doctor, while still giving yourself time to heal.  Rest when you feel tired. You will be asked to do the following in order to speed your recovery:  - Cough and breathe deeply. This helps to clear and expand your lungs and can prevent pneumonia after surgery.  - STAY ACTIVE WHEN YOU GET HOME. Do mild physical activity. Walking or moving your legs help your circulation and body functions return to normal. Do not try to get up or walk alone the first time after surgery.   -If you develop swelling on one leg or the other, pain in the back of your leg, redness/warmth in one of your legs, please call the office or go to the Emergency Room to have a doppler to rule out a blood clot. For shortness of breath, chest pain-seek care in the Emergency Room as soon as possible. - Actively manage your pain. Managing your pain lets you move in comfort. We will ask you to rate your pain on a scale of zero to 10. It is your responsibility to tell your doctor or nurse where and how much you hurt so your pain can be treated.  Special Considerations -If you are diabetic, you may be placed on insulin after surgery to have closer control over your blood sugars to promote healing and recovery.  This does not mean that you will be discharged on insulin.  If applicable, your oral antidiabetics will be resumed when you are tolerating a solid diet.  -Your final pathology results from surgery  should be available around one week after surgery and the results will be relayed to you when available.  -FMLA forms can be faxed to [phone] and please allow 5-7 business days for completion.  Pain Management After Surgery -You will be prescribed your pain medication and bowel regimen medications before surgery so that you can have these available when you are discharged from the hospital. The pain medication is for use ONLY AFTER surgery and a new prescription will not be given.    -Make sure that you have Tylenol  and Ibuprofen  IF YOU ARE ABLE TO TAKE THESE MEDICATIONS at home to use on a regular basis after surgery for pain control. We recommend alternating the medications every hour to six hours since they work differently and are processed in the body differently for pain relief.  -Review the attached handout on narcotic use and their risks and side effects.   Bowel Regimen -You will be prescribed Sennakot-S to take nightly to prevent constipation especially if you are taking the narcotic pain medication intermittently.  It is important to prevent constipation and drink adequate amounts of liquids. You can stop taking this medication when you are not taking pain medication and you are back on your normal bowel routine.  Risks of Surgery Risks of surgery are low but include bleeding, infection, damage to surrounding structures, re-operation, blood clots, and very rarely death.   Blood Transfusion Information (For the consent to be signed before surgery)  We will be checking your blood type before surgery so in case of emergencies, we will know what type of blood you would need.                                            WHAT IS A BLOOD TRANSFUSION?  A transfusion is the replacement of blood or some of its parts. Blood is made up of multiple cells which provide different functions. Red blood cells carry oxygen  and are used for blood loss replacement. White blood cells fight against infection. Platelets control bleeding. Plasma helps clot blood. Other blood products are available for specialized needs, such as hemophilia or other clotting disorders. BEFORE THE TRANSFUSION  Who gives blood for transfusions?  You may be able to donate blood to be used at a later date on yourself (autologous donation). Relatives can be asked to donate blood. This is generally not any safer than if you have received blood from a stranger. The same precautions are taken to ensure  safety when a relative's blood is donated. Healthy volunteers who are fully evaluated to make sure their blood is safe. This is blood bank blood. Transfusion therapy is the safest it has ever been in the practice of medicine. Before blood is taken from a donor, a complete history is taken to make sure that person has no history of diseases nor engages in risky social behavior (examples are intravenous drug use or sexual activity with multiple partners). The donor's travel history is screened to minimize risk of transmitting infections, such as malaria. The donated blood is tested for signs of infectious diseases, such as HIV and hepatitis. The blood is then tested to be sure it is compatible with you in order to minimize the chance of a transfusion reaction. If you or a relative donates blood, this is often done in anticipation of surgery and is not appropriate for emergency situations. It takes  many days to process the donated blood. RISKS AND COMPLICATIONS Although transfusion therapy is very safe and saves many lives, the main dangers of transfusion include:  Getting an infectious disease. Developing a transfusion reaction. This is an allergic reaction to something in the blood you were given. Every precaution is taken to prevent this. The decision to have a blood transfusion has been considered carefully by your caregiver before blood is given. Blood is not given unless the benefits outweigh the risks.  AFTER SURGERY INSTRUCTIONS  Return to work: 4-6 weeks if applicable  Activity: 1. Be up and out of the bed during the day.  Take a nap if needed.  You may walk up steps but be careful and use the hand rail.  Stair climbing will tire you more than you think, you may need to stop part way and rest.   2. No lifting or straining for 6 weeks over 10 pounds. No pushing, pulling, straining for 6 weeks.  3. No driving for 4-89 days when the following criteria have been met: Do not drive if you are  taking narcotic pain medicine and make sure that your reaction time has returned.   4. You can shower as soon as the next day after surgery. Shower daily.  Use your regular soap and water (not directly on the incision) and pat your incision(s) dry afterwards; don't rub.  No tub baths or submerging your body in water until cleared by your surgeon. If you have the soap that was given to you by pre-surgical testing that was used before surgery, you do not need to use it afterwards because this can irritate your incisions.   5. No sexual activity and nothing in the vagina for 6 weeks.  6. You may experience a small amount of clear drainage from your incisions, which is normal.  If the drainage persists, increases, or changes color please call the office.  7. Do not use creams, lotions, or ointments such as neosporin on your incisions after surgery until advised by your surgeon because they can cause removal of the dermabond glue on your incisions.    8. Take Tylenol  or ibuprofen  first for pain if you are able to take these medications and only use narcotic pain medication for severe pain not relieved by the Tylenol  or Ibuprofen .  Monitor your Tylenol  intake to a max of 4,000 mg in a 24 hour period. You can alternate these medications after surgery.  Diet: 1. Low sodium Heart Healthy Diet is recommended but you are cleared to resume your normal (before surgery) diet after your procedure.  2. It is safe to use a laxative, such as Miralax  or Colace, if you have difficulty moving your bowels before surgery. You have been prescribed Sennakot-S to take at bedtime every evening after surgery to keep bowel movements regular and to prevent constipation.    Wound Care: 1. Keep clean and dry.  Shower daily.  Reasons to call the Doctor: Fever - Oral temperature greater than 100.4 degrees Fahrenheit Foul-smelling vaginal discharge Difficulty urinating Nausea and vomiting Increased pain at the site of the  incision that is unrelieved with pain medicine. Difficulty breathing with or without chest pain New calf pain especially if only on one side Sudden, continuing increased vaginal bleeding with or without clots.   Contacts: For questions or concerns you should contact:  Dr. Comer Dollar at [phone]  Eleanor Epps, NP at [phone]  After Hours: call [phone] and have the GYN Oncologist paged/contacted (after 5  pm or on the weekends). You will speak with an after hours RN and let he or she know you have had surgery.  Messages sent via mychart are for non-urgent matters and are not responded to after hours so for urgent needs, please call the after hours number.

## 2024-11-21 ENCOUNTER — Inpatient Hospital Stay

## 2024-11-21 ENCOUNTER — Other Ambulatory Visit: Payer: Self-pay | Admitting: *Deleted

## 2024-11-21 ENCOUNTER — Encounter: Payer: Self-pay | Admitting: Gynecologic Oncology

## 2024-11-21 ENCOUNTER — Ambulatory Visit: Payer: Self-pay | Admitting: Gynecologic Oncology

## 2024-11-21 ENCOUNTER — Inpatient Hospital Stay: Attending: Gynecologic Oncology | Admitting: Gynecologic Oncology

## 2024-11-21 VITALS — BP 120/79 | HR 75 | Temp 99.5°F | Resp 19 | Wt 154.2 lb

## 2024-11-21 DIAGNOSIS — R300 Dysuria: Secondary | ICD-10-CM

## 2024-11-21 DIAGNOSIS — R197 Diarrhea, unspecified: Secondary | ICD-10-CM | POA: Diagnosis not present

## 2024-11-21 DIAGNOSIS — R634 Abnormal weight loss: Secondary | ICD-10-CM

## 2024-11-21 DIAGNOSIS — D3912 Neoplasm of uncertain behavior of left ovary: Secondary | ICD-10-CM | POA: Diagnosis not present

## 2024-11-21 DIAGNOSIS — R5383 Other fatigue: Secondary | ICD-10-CM

## 2024-11-21 DIAGNOSIS — N9489 Other specified conditions associated with female genital organs and menstrual cycle: Secondary | ICD-10-CM

## 2024-11-21 LAB — CBC (CANCER CENTER ONLY)
HCT: 38.3 % (ref 36.0–46.0)
Hemoglobin: 12.9 g/dL (ref 12.0–15.0)
MCH: 32.1 pg (ref 26.0–34.0)
MCHC: 33.7 g/dL (ref 30.0–36.0)
MCV: 95.3 fL (ref 80.0–100.0)
Platelet Count: 211 K/uL (ref 150–400)
RBC: 4.02 MIL/uL (ref 3.87–5.11)
RDW: 14.1 % (ref 11.5–15.5)
WBC Count: 5.4 K/uL (ref 4.0–10.5)
nRBC: 0 % (ref 0.0–0.2)

## 2024-11-21 LAB — URINALYSIS, COMPLETE (UACMP) WITH MICROSCOPIC
Bilirubin Urine: NEGATIVE
Glucose, UA: NEGATIVE mg/dL
Hgb urine dipstick: NEGATIVE
Ketones, ur: NEGATIVE mg/dL
Leukocytes,Ua: NEGATIVE
Nitrite: NEGATIVE
Protein, ur: NEGATIVE mg/dL
Specific Gravity, Urine: 1.026 (ref 1.005–1.030)
pH: 5 (ref 5.0–8.0)

## 2024-11-21 LAB — CMP (CANCER CENTER ONLY)
ALT: 20 U/L (ref 0–44)
AST: 21 U/L (ref 15–41)
Albumin: 4.7 g/dL (ref 3.5–5.0)
Alkaline Phosphatase: 91 U/L (ref 38–126)
Anion gap: 10 (ref 5–15)
BUN: 20 mg/dL (ref 6–20)
CO2: 26 mmol/L (ref 22–32)
Calcium: 10 mg/dL (ref 8.9–10.3)
Chloride: 106 mmol/L (ref 98–111)
Creatinine: 0.68 mg/dL (ref 0.44–1.00)
GFR, Estimated: >60 mL/min
Glucose, Bld: 89 mg/dL (ref 70–99)
Potassium: 4.1 mmol/L (ref 3.5–5.1)
Sodium: 143 mmol/L (ref 135–145)
Total Bilirubin: 0.2 mg/dL (ref 0.0–1.2)
Total Protein: 7.5 g/dL (ref 6.5–8.1)

## 2024-11-21 LAB — TSH: TSH: 0.736 u[IU]/mL (ref 0.350–4.500)
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
## 2024-11-21 NOTE — Telephone Encounter (Signed)
-----   Message from Comer Dollar, MD sent at 11/21/2024  1:29 PM EST ----- Please let her know that her urinalysis shows some bacteria but overall does not look like there is an infection.  I am going to wait until the urine culture comes back and if there is have any  evidence of infection I will send in an antibiotic.

## 2024-11-21 NOTE — Telephone Encounter (Signed)
 Spoke with patient and relayed message from Dr. Viktoria that patient's urinalysis shows some bacteria but overall does not look like there is an infection. I am going to wait until the urine culture comes back and if there is  any evidence of infection I will send in an antibiotic.   Pt verbalized understanding and thanked the office for calling.

## 2024-11-23 LAB — URINE CULTURE: Culture: >=100000 — AB

## 2024-11-23 MED ORDER — NITROFURANTOIN MONOHYD MACRO 100 MG PO CAPS: 100.0000 mg | ORAL_CAPSULE | Freq: Two times a day (BID) | ORAL | 0 refills | Status: AC

## 2024-11-24 ENCOUNTER — Telehealth: Payer: Self-pay | Admitting: *Deleted

## 2024-11-24 NOTE — Telephone Encounter (Signed)
 Per Dr Viktoria fax records and referral to Dr Rollin at [facility] [phone])

## 2024-11-24 NOTE — Telephone Encounter (Signed)
-----   Message from Comer Dollar, MD sent at 11/23/2024  2:37 PM EST ----- Regarding: FW: Could you also please call this patient to check in? Her urine culture shows that it is sensitive to the antibiotic I sent in on Saturday. Thanks so much.

## 2024-11-24 NOTE — Telephone Encounter (Signed)
 Spoke with patient who returned call from office. Pt is aware that an antibiotic has been sent in that is sensitive to the urine culture. Pt states she will be picking the Rx up today and thanked the office for calling.

## 2024-11-24 NOTE — Telephone Encounter (Signed)
 2nd attempt to reach patient to relay message from provider. Left voicemail requesting call back.

## 2024-11-24 NOTE — Telephone Encounter (Signed)
 Attempted to reach patient to relay message from provider. Left voicemail requesting call back.

## 2024-11-25 ENCOUNTER — Ambulatory Visit (HOSPITAL_COMMUNITY)
Admission: RE | Admit: 2024-11-25 | Discharge: 2024-11-25 | Disposition: A | Discharge status: Home / Self Care | Source: Ambulatory Visit | Attending: Gynecologic Oncology | Admitting: Gynecologic Oncology

## 2024-11-25 DIAGNOSIS — K439 Ventral hernia without obstruction or gangrene: Secondary | ICD-10-CM | POA: Insufficient documentation

## 2024-11-25 DIAGNOSIS — R5383 Other fatigue: Secondary | ICD-10-CM | POA: Insufficient documentation

## 2024-11-25 DIAGNOSIS — R590 Localized enlarged lymph nodes: Secondary | ICD-10-CM | POA: Diagnosis not present

## 2024-11-25 DIAGNOSIS — R634 Abnormal weight loss: Secondary | ICD-10-CM | POA: Insufficient documentation

## 2024-11-25 DIAGNOSIS — I700 Atherosclerosis of aorta: Secondary | ICD-10-CM | POA: Insufficient documentation

## 2024-11-25 DIAGNOSIS — N838 Other noninflammatory disorders of ovary, fallopian tube and broad ligament: Secondary | ICD-10-CM | POA: Insufficient documentation

## 2024-11-25 MED ORDER — IOHEXOL 9 MG/ML PO SOLN
1000.0000 mL | ORAL | Status: AC
  Administered 2024-11-25: 1000 mL via ORAL

## 2024-11-25 MED ORDER — IOHEXOL 300 MG/ML  SOLN
100.0000 mL | Freq: Once | INTRAMUSCULAR | Status: AC | PRN
  Administered 2024-11-25: 100 mL via INTRAVENOUS

## 2024-12-01 ENCOUNTER — Other Ambulatory Visit: Payer: Self-pay | Admitting: Gynecologic Oncology

## 2024-12-01 ENCOUNTER — Telehealth: Payer: Self-pay | Admitting: *Deleted

## 2024-12-01 ENCOUNTER — Telehealth: Payer: Self-pay | Admitting: Oncology

## 2024-12-01 DIAGNOSIS — R634 Abnormal weight loss: Secondary | ICD-10-CM

## 2024-12-01 NOTE — Telephone Encounter (Signed)
 Attempted to reach patient. Left voicemail requesting call back to [phone].

## 2024-12-01 NOTE — Telephone Encounter (Signed)
 Called [facility] regarding referral to Dr. Rollin.  They have the referral and it is being reviewed by Dr. Rollin.

## 2024-12-02 ENCOUNTER — Encounter (HOSPITAL_COMMUNITY)

## 2024-12-02 NOTE — Telephone Encounter (Signed)
 Spoke with patient and relayed message from provider that patient's CEA (tumor marker) was not drawn on 12/19 as ordered and patient was scheduled for a lab appt. On 12/31 at 1000. Pt agreed to date and time and also relayed that we are working on patient's referral to GI and Dr. Curtis office has been called. Pt verbalized understanding and thanked the office for calling.

## 2024-12-03 ENCOUNTER — Telehealth: Payer: Self-pay | Admitting: *Deleted

## 2024-12-03 ENCOUNTER — Inpatient Hospital Stay

## 2024-12-03 NOTE — Telephone Encounter (Signed)
 Spoke with patient who called to reschedule her lab appointment today for her CEA to Friday, at 3 pm. Pt agreed to lab appt. 1/2 at 3 pm.

## 2024-12-05 ENCOUNTER — Telehealth: Payer: Self-pay | Admitting: Oncology

## 2024-12-05 ENCOUNTER — Inpatient Hospital Stay: Attending: Gynecologic Oncology

## 2024-12-05 DIAGNOSIS — D3912 Neoplasm of uncertain behavior of left ovary: Secondary | ICD-10-CM | POA: Insufficient documentation

## 2024-12-05 DIAGNOSIS — R634 Abnormal weight loss: Secondary | ICD-10-CM | POA: Diagnosis present

## 2024-12-05 DIAGNOSIS — R970 Elevated carcinoembryonic antigen [CEA]: Secondary | ICD-10-CM | POA: Insufficient documentation

## 2024-12-05 NOTE — Telephone Encounter (Signed)
 Called Dr. Curtis office and Veronica Francis has an appointment on 12/08/24 at 3:00 with Dr. Rollin.

## 2024-12-08 LAB — CEA (ACCESS): CEA (CHCC): 5.52 ng/mL — ABNORMAL HIGH (ref 0.00–5.00)

## 2024-12-10 ENCOUNTER — Ambulatory Visit: Admit: 2024-12-10 | Admitting: Gynecologic Oncology

## 2024-12-10 DIAGNOSIS — R634 Abnormal weight loss: Secondary | ICD-10-CM

## 2024-12-10 DIAGNOSIS — N9489 Other specified conditions associated with female genital organs and menstrual cycle: Secondary | ICD-10-CM

## 2024-12-10 SURGERY — SALPINGO-OOPHORECTOMY, BILATERAL, ROBOT-ASSISTED
Anesthesia: General | Laterality: Bilateral

## 2024-12-19 ENCOUNTER — Telehealth: Payer: Self-pay | Admitting: *Deleted

## 2024-12-19 NOTE — Telephone Encounter (Signed)
 Spoke with Veronica Francis and relayed message from provider that we have a date for her procedure with Dr. Viktoria on 2/28 which this would be after her colonoscopy on 2/19.  Patient states she is unaware that Dr. Viktoria is recommending surgery? She states they talked and surgery was not needed? Relayed to patient that her CEA (tumor marker) is still a little above the normal limit and Dr. Viktoria sent patient a message in regards to this via MyChart. Pt states she doesn't do technology and didn't see the message.  Patient states she needs to think about this and doesn't want to have surgery at this time. Advised patient her message will be relayed to providers.

## 2024-12-19 NOTE — Telephone Encounter (Signed)
 Attempted to reach patient to relay message from provider that we have a date for her surgery. It has been scheduled for Wednesday, February 25 th. Left voicemail requesting call back to [phone].

## 2025-01-01 ENCOUNTER — Encounter: Payer: Self-pay | Admitting: Gynecologic Oncology

## 2025-01-07 NOTE — Telephone Encounter (Signed)
 2nd attempt to reach patient to relay that patient has a message in MyChart from Dr. Viktoria. Unable to leave a message due to patient's mailbox being full.

## 2025-01-08 ENCOUNTER — Telehealth: Payer: Self-pay | Admitting: *Deleted

## 2025-01-08 NOTE — Telephone Encounter (Signed)
 3rd attempt to reach patient to relay message from provider. Unable to leave a message due to her voicemail box is full.

## 2025-01-08 NOTE — Telephone Encounter (Signed)
-----   Message from Comer Dollar, MD sent at 01/08/2025  8:03 AM EST ----- Hi Ami, Could you please call this patient and see if you can reach her again?  I have tried several times and sent a MyChart message on 1/29.  If you are able to reach her, could you relay the message that I sent and get an update from her about her weight? Thanks! Kat

## 2025-01-08 NOTE — Telephone Encounter (Signed)
 Patient returned call from office. Relayed to patient that Dr.Tucker has been trying to reach her.   Relayed MyChart message from Dr. Viktoria:   Hi Veronica Francis, I just left you a message.  I have been trying to reach you to discuss your scan results and why I pushed to get you in for a colonoscopy.  On your CT scan, that small area in the omentum (the fatty apron that sits in front of the intestines) had grown just a little bit.  Also, the CEA, which is the blood test, is still high. The ultrasound that we got in December showed what looks like a benign cyst on the 1 ovary, overall very small.  I am still concerned though about the spot in the omentum and would favor surgery to make sure that this is not something cancerous rather than close follow-up.  If after your colonoscopy you do not want to proceed with surgery, I would recommend repeat imaging 3 months after your last scan. Can you give me an update about your weight? Hope you are doing well and please stay safe/warm this weekend! Best, Dr. Viktoria  Patient verbalized understanding and states it's a lot to process and she is leaning towards having surgery. Patient states her colonoscopy is scheduled for 2/19.   Pt also needs to contact her PCP for an appointment and states she will do that today.  Pt also states she hasn't weighed herself recently, but feels she may have lost more weight.  Advised patient to weigh herself today when she gets home from work and please call the office back.   Patient reports that while eating she feels full easy and then has stomach discomfort with nausea.   Also sent patient a link to create a new password for MyChart and advised patient to read the message that I read to her from Dr. Viktoria so patient can process all the information and reach back out to the office with questions. Pt verbalized understanding and thanked the office for calling.

## 2025-01-28 ENCOUNTER — Ambulatory Visit (HOSPITAL_COMMUNITY): Admit: 2025-01-28 | Admitting: Gynecologic Oncology
